# Patient Record
Sex: Male | Born: 1945
Health system: Southern US, Community
[De-identification: ages and names within clinical notes are randomized; demographics above are authoritative.]

## PROBLEM LIST (undated history)

## (undated) DIAGNOSIS — Z789 Other specified health status: Secondary | ICD-10-CM

## (undated) DIAGNOSIS — J302 Other seasonal allergic rhinitis: Secondary | ICD-10-CM

## (undated) DIAGNOSIS — R42 Dizziness and giddiness: Secondary | ICD-10-CM

## (undated) DIAGNOSIS — Z974 Presence of external hearing-aid: Secondary | ICD-10-CM

## (undated) DIAGNOSIS — Z973 Presence of spectacles and contact lenses: Secondary | ICD-10-CM

## (undated) HISTORY — DX: Dizziness and giddiness: R42

## (undated) HISTORY — PX: COLONOSCOPY: SHX174

## (undated) HISTORY — PX: TONSILLECTOMY: SUR1361

---

## 1974-07-22 HISTORY — PX: HERNIA REPAIR: SHX51

## 1991-07-23 HISTORY — PX: KNEE ARTHROSCOPY: SUR90

## 1999-11-09 ENCOUNTER — Encounter: Payer: Self-pay | Admitting: Orthopedic Surgery

## 1999-11-09 ENCOUNTER — Ambulatory Visit (HOSPITAL_COMMUNITY): Admission: RE | Admit: 1999-11-09 | Discharge: 1999-11-09 | Payer: Self-pay | Admitting: Orthopedic Surgery

## 2001-07-22 HISTORY — PX: TYMPANOPLASTY: SHX33

## 2003-01-17 ENCOUNTER — Encounter (INDEPENDENT_AMBULATORY_CARE_PROVIDER_SITE_OTHER): Payer: Self-pay | Admitting: *Deleted

## 2003-01-17 ENCOUNTER — Ambulatory Visit (HOSPITAL_COMMUNITY): Admission: RE | Admit: 2003-01-17 | Discharge: 2003-01-17 | Payer: Self-pay | Admitting: Gastroenterology

## 2005-05-10 ENCOUNTER — Ambulatory Visit: Payer: Self-pay | Admitting: Oncology

## 2007-07-23 HISTORY — PX: EYE SURGERY: SHX253

## 2009-07-22 HISTORY — PX: SHOULDER ARTHROSCOPY W/ ROTATOR CUFF REPAIR: SHX2400

## 2009-10-16 ENCOUNTER — Encounter: Admission: RE | Admit: 2009-10-16 | Discharge: 2009-10-16 | Payer: Self-pay | Admitting: Internal Medicine

## 2010-12-07 NOTE — Op Note (Signed)
   NAME:  Andrew Knight, Andrew Knight                       ACCOUNT NO.:  [id number]   MEDICAL RECORD NO.:  [id number]                   PATIENT TYPE:  AMB   LOCATION:  ENDO                                 FACILITY:  [facility]   PHYSICIAN:  Bernette Redbird, M.D.                DATE OF BIRTH:  04/10/1946   DATE OF PROCEDURE:  01/17/2003  DATE OF DISCHARGE:                                 OPERATIVE REPORT   PROCEDURE:  Colonoscopy with biopsy.   INDICATIONS FOR PROCEDURE:  This is a 65 year old gentleman who had a  screening flexible sigmoidoscopy by me about five years ago which was normal  and comes in now with a family history of colon polyps in his father,  unknown histology, but no worrisome symptoms.   FINDINGS:  Diminutive polyp above cecum.  Scattered diverticulosis.   DESCRIPTION OF PROCEDURE:  The nature, purpose, and risks of the procedure  had been discussed with the patient who provided written consent.  Sedation  was fentanyl 50 mcg and Versed 5 mg IV without arrhythmias or desaturation.   The Olympus adult video colonoscope was easily advanced to the cecum as  identified by clear visualization of the appendiceal orifice and pullback  was then performed.  Just above the cecum was a tiny 2 mm sessile polyp  removed by several cold biopsies.  No other polyps were seen and there was  no evidence of cancer, colitis, vascular malformations, or significant  diverticulosis, although, a few scattered diverticula were noted in the  colon.  Retroflexion in the rectum as well as reinspection of the  rectosigmoid was unremarkable.  Digital examination of the prostate was  unremarkable.  The patient tolerated the procedure well and there were no  apparent complications.   IMPRESSION:  Solitary diminutive polyp.  Minimal diverticulosis.  Otherwise  unremarkable examination.   PLAN:  Await pathology of the polyp.  Probable follow-up colonoscopy in five  years in view of the family history of  colon polyps.                                               Bernette Redbird, M.D.    RB/MEDQ  D:  01/17/2003  T:  01/17/2003  Job:  [id number]   cc:   Christella Noa, M.D.  [address] [place]  [place], [place] [address]  Fax: [phone]

## 2011-06-06 ENCOUNTER — Encounter (HOSPITAL_BASED_OUTPATIENT_CLINIC_OR_DEPARTMENT_OTHER)
Admission: RE | Disposition: A | Discharge status: Home / Self Care | Payer: Self-pay | Source: Ambulatory Visit | Attending: Orthopedic Surgery

## 2011-06-06 ENCOUNTER — Encounter (HOSPITAL_BASED_OUTPATIENT_CLINIC_OR_DEPARTMENT_OTHER): Payer: Self-pay | Admitting: Certified Registered"

## 2011-06-06 ENCOUNTER — Ambulatory Visit (HOSPITAL_BASED_OUTPATIENT_CLINIC_OR_DEPARTMENT_OTHER)
Admission: RE | Admit: 2011-06-06 | Discharge: 2011-06-06 | Disposition: A | Discharge status: Home / Self Care | Payer: Medicare Other | Source: Ambulatory Visit | Attending: Orthopedic Surgery | Admitting: Orthopedic Surgery

## 2011-06-06 ENCOUNTER — Encounter (HOSPITAL_BASED_OUTPATIENT_CLINIC_OR_DEPARTMENT_OTHER): Payer: Self-pay | Admitting: *Deleted

## 2011-06-06 ENCOUNTER — Ambulatory Visit (HOSPITAL_BASED_OUTPATIENT_CLINIC_OR_DEPARTMENT_OTHER): Payer: Medicare Other | Admitting: Certified Registered"

## 2011-06-06 DIAGNOSIS — M25819 Other specified joint disorders, unspecified shoulder: Secondary | ICD-10-CM | POA: Insufficient documentation

## 2011-06-06 DIAGNOSIS — M66329 Spontaneous rupture of flexor tendons, unspecified upper arm: Secondary | ICD-10-CM | POA: Insufficient documentation

## 2011-06-06 DIAGNOSIS — M719 Bursopathy, unspecified: Secondary | ICD-10-CM | POA: Insufficient documentation

## 2011-06-06 DIAGNOSIS — Z5333 Arthroscopic surgical procedure converted to open procedure: Secondary | ICD-10-CM | POA: Insufficient documentation

## 2011-06-06 DIAGNOSIS — Z87891 Personal history of nicotine dependence: Secondary | ICD-10-CM | POA: Insufficient documentation

## 2011-06-06 DIAGNOSIS — M67919 Unspecified disorder of synovium and tendon, unspecified shoulder: Secondary | ICD-10-CM | POA: Insufficient documentation

## 2011-06-06 HISTORY — PX: SHOULDER ARTHROSCOPY: SHX128

## 2011-06-06 HISTORY — DX: Other specified health status: Z78.9

## 2011-06-06 SURGERY — ARTHROSCOPY, SHOULDER
Anesthesia: General | Site: Shoulder | Laterality: Right | Wound class: Clean

## 2011-06-06 MED ORDER — OXYCODONE-ACETAMINOPHEN 5-325 MG PO TABS: 1.0000 | ORAL_TABLET | ORAL | Status: AC | PRN

## 2011-06-06 MED ORDER — PROPOFOL 10 MG/ML IV EMUL
INTRAVENOUS | Status: DC | PRN
  Administered 2011-06-06: 180 mg via INTRAVENOUS

## 2011-06-06 MED ORDER — BUPIVACAINE HCL (PF) 0.25 % IJ SOLN
INTRAMUSCULAR | Status: DC | PRN
  Administered 2011-06-06: 10 mL

## 2011-06-06 MED ORDER — FENTANYL CITRATE 0.05 MG/ML IJ SOLN
INTRAMUSCULAR | Status: DC | PRN
  Administered 2011-06-06: 100 ug via INTRAVENOUS

## 2011-06-06 MED ORDER — EPHEDRINE SULFATE 50 MG/ML IJ SOLN
INTRAMUSCULAR | Status: DC | PRN
  Administered 2011-06-06: 10 mg via INTRAVENOUS

## 2011-06-06 MED ORDER — SODIUM CHLORIDE 0.9 % IR SOLN
Status: DC | PRN
  Administered 2011-06-06: 2 mL

## 2011-06-06 MED ORDER — SUCCINYLCHOLINE CHLORIDE 20 MG/ML IJ SOLN
INTRAMUSCULAR | Status: DC | PRN
  Administered 2011-06-06: 100 mg via INTRAVENOUS

## 2011-06-06 MED ORDER — ONDANSETRON HCL 4 MG/2ML IJ SOLN: 4.0000 mg | Freq: Once | INTRAMUSCULAR | Status: DC | PRN

## 2011-06-06 MED ORDER — HYDROMORPHONE HCL PF 1 MG/ML IJ SOLN: 0.2500 mg | INTRAMUSCULAR | Status: DC | PRN

## 2011-06-06 MED ORDER — ONDANSETRON HCL 4 MG/2ML IJ SOLN
INTRAMUSCULAR | Status: DC | PRN
  Administered 2011-06-06: 4 mg via INTRAVENOUS

## 2011-06-06 MED ORDER — MIDAZOLAM HCL 2 MG/2ML IJ SOLN
1.0000 mg | INTRAMUSCULAR | Status: DC | PRN
  Administered 2011-06-06: 2 mg via INTRAVENOUS

## 2011-06-06 MED ORDER — LACTATED RINGERS IV SOLN
INTRAVENOUS | Status: DC
  Administered 2011-06-06: 09:00:00 via INTRAVENOUS

## 2011-06-06 MED ORDER — MIDAZOLAM HCL 2 MG/2ML IJ SOLN: 1.0000 mg | Freq: Once | INTRAMUSCULAR | Status: DC

## 2011-06-06 MED ORDER — LACTATED RINGERS IV SOLN: INTRAVENOUS | Status: DC

## 2011-06-06 MED ORDER — FENTANYL CITRATE 0.05 MG/ML IJ SOLN: 50.0000 ug | INTRAMUSCULAR | Status: DC | PRN

## 2011-06-06 MED ORDER — CEFAZOLIN SODIUM 1-5 GM-% IV SOLN
1.0000 g | Freq: Once | INTRAVENOUS | Status: AC
  Administered 2011-06-06: 2 g via INTRAVENOUS

## 2011-06-06 MED ORDER — FENTANYL CITRATE 0.05 MG/ML IJ SOLN
100.0000 ug | Freq: Once | INTRAMUSCULAR | Status: AC
  Administered 2011-06-06: 100 ug via INTRAVENOUS

## 2011-06-06 MED ORDER — DEXAMETHASONE SODIUM PHOSPHATE 4 MG/ML IJ SOLN
INTRAMUSCULAR | Status: DC | PRN
  Administered 2011-06-06: 4 mg via INTRAVENOUS

## 2011-06-06 SURGICAL SUPPLY — 92 items
ADH SKN CLS APL DERMABOND .7 (GAUZE/BANDAGES/DRESSINGS) ×1
APL SKNCLS STERI-STRIP NONHPOA (GAUZE/BANDAGES/DRESSINGS) ×1
BENZOIN TINCTURE PRP APPL 2/3 (GAUZE/BANDAGES/DRESSINGS) ×1 IMPLANT
BLADE 4.2CUDA (BLADE) IMPLANT
BLADE CUDA 5.5 (BLADE) IMPLANT
BLADE CUTTER GATOR 3.5 (BLADE) IMPLANT
BLADE GREAT WHITE 4.2 (BLADE) IMPLANT
BLADE SURG 15 STRL LF DISP TIS (BLADE) IMPLANT
BLADE SURG 15 STRL SS (BLADE)
BLADE SURG ROTATE 9660 (MISCELLANEOUS) IMPLANT
BUR 3.5 LG SPHERICAL (BURR) IMPLANT
BUR OVAL 4.0 (BURR) ×2 IMPLANT
BUR OVAL 6.0 (BURR) IMPLANT
BUR VERTEX HOODED 4.5 (BURR) ×1 IMPLANT
BURR 3.5 LG SPHERICAL (BURR)
CANISTER OMNI JUG 16 LITER (MISCELLANEOUS) ×2 IMPLANT
CANISTER SUCTION 2500CC (MISCELLANEOUS) IMPLANT
CANNULA 5.75X71 LONG (CANNULA) ×2 IMPLANT
CANNULA TWIST IN 8.25X7CM (CANNULA) IMPLANT
CHLORAPREP W/TINT 26ML (MISCELLANEOUS) ×2 IMPLANT
CLOTH BEACON ORANGE TIMEOUT ST (SAFETY) ×2 IMPLANT
DECANTER SPIKE VIAL GLASS SM (MISCELLANEOUS) IMPLANT
DERMABOND ADVANCED (GAUZE/BANDAGES/DRESSINGS) ×1
DERMABOND ADVANCED .7 DNX12 (GAUZE/BANDAGES/DRESSINGS) IMPLANT
DRAPE INCISE IOBAN 66X45 STRL (DRAPES) ×2 IMPLANT
DRAPE STERI 35X30 U-POUCH (DRAPES) ×2 IMPLANT
DRAPE SURG 17X23 STRL (DRAPES) ×2 IMPLANT
DRAPE U 20/CS (DRAPES) ×2 IMPLANT
DRAPE U-SHAPE 47X51 STRL (DRAPES) ×2 IMPLANT
DRAPE U-SHAPE 76X120 STRL (DRAPES) ×4 IMPLANT
DRSG PAD ABDOMINAL 8X10 ST (GAUZE/BANDAGES/DRESSINGS) ×2 IMPLANT
ELECT REM PT RETURN 9FT ADLT (ELECTROSURGICAL) ×2
ELECTRODE REM PT RTRN 9FT ADLT (ELECTROSURGICAL) ×1 IMPLANT
FIBER LOOP ×1 IMPLANT
FIBER LOOP  AR7234 ×1 IMPLANT
GAUZE SPONGE 4X4 16PLY XRAY LF (GAUZE/BANDAGES/DRESSINGS) IMPLANT
GAUZE XEROFORM 1X8 LF (GAUZE/BANDAGES/DRESSINGS) ×2 IMPLANT
GLOVE BIO SURGEON STRL SZ 6.5 (GLOVE) ×1 IMPLANT
GLOVE BIO SURGEON STRL SZ7.5 (GLOVE) ×3 IMPLANT
GLOVE BIOGEL PI IND STRL 8 (GLOVE) ×2 IMPLANT
GLOVE BIOGEL PI INDICATOR 8 (GLOVE) ×1
GOWN PREVENTION PLUS XLARGE (GOWN DISPOSABLE) ×2 IMPLANT
LASSO CRESCENT QUICKPASS (SUTURE) ×1 IMPLANT
NDL 1/2 CIR CATGUT .05X1.09 (NEEDLE) IMPLANT
NDL SCORPION MULTI FIRE (NEEDLE) IMPLANT
NDL SUT 6 .5 CRC .975X.05 MAYO (NEEDLE) IMPLANT
NEEDLE 1/2 CIR CATGUT .05X1.09 (NEEDLE) IMPLANT
NEEDLE MAYO TAPER (NEEDLE)
NEEDLE SCORPION MULTI FIRE (NEEDLE) IMPLANT
NS IRRIG 1000ML POUR BTL (IV SOLUTION) IMPLANT
PACK ARTHROSCOPY DSU (CUSTOM PROCEDURE TRAY) ×2 IMPLANT
PACK BASIN DAY SURGERY FS (CUSTOM PROCEDURE TRAY) ×2 IMPLANT
PENCIL BUTTON HOLSTER BLD 10FT (ELECTRODE) IMPLANT
RESECTOR FULL RADIUS 4.2MM (BLADE) ×2 IMPLANT
RESECTOR FULL RADIUS 4.8MM (BLADE) IMPLANT
SHEET MEDIUM DRAPE 40X70 STRL (DRAPES) ×1 IMPLANT
SLEEVE SCD COMPRESS KNEE MED (MISCELLANEOUS) ×2 IMPLANT
SLING ARM FOAM STRAP LRG (SOFTGOODS) ×1 IMPLANT
SLING ARM FOAM STRAP MED (SOFTGOODS) IMPLANT
SLING ARM FOAM STRAP XLG (SOFTGOODS) IMPLANT
SLING ARM IMMOBILIZER MED (SOFTGOODS) IMPLANT
SPONGE GAUZE 4X4 12PLY (GAUZE/BANDAGES/DRESSINGS) ×2 IMPLANT
SPONGE LAP 4X18 X RAY DECT (DISPOSABLE) IMPLANT
STRIP CLOSURE SKIN 1/2X4 (GAUZE/BANDAGES/DRESSINGS) ×1 IMPLANT
SUCTION FRAZIER TIP 10 FR DISP (SUCTIONS) ×2 IMPLANT
SUPPORT WRAP ARM LG (MISCELLANEOUS) IMPLANT
SUT BONE WAX W31G (SUTURE) IMPLANT
SUT ETHIBOND 2 OS 4 DA (SUTURE) IMPLANT
SUT ETHILON 3 0 PS 1 (SUTURE) ×2 IMPLANT
SUT ETHILON 4 0 PS 2 18 (SUTURE) IMPLANT
SUT FIBERWIRE #2 38 T-5 BLUE (SUTURE)
SUT FIBERWIRE 2-0 18 17.9 3/8 (SUTURE)
SUT MNCRL AB 3-0 PS2 18 (SUTURE) IMPLANT
SUT MNCRL AB 4-0 PS2 18 (SUTURE) IMPLANT
SUT PDS AB 0 CT 36 (SUTURE) IMPLANT
SUT PROLENE 3 0 PS 2 (SUTURE) ×2 IMPLANT
SUT VIC AB 0 CT1 18XCR BRD 8 (SUTURE) IMPLANT
SUT VIC AB 0 CT1 8-18 (SUTURE)
SUT VIC AB 2-0 SH 18 (SUTURE) IMPLANT
SUT VIC AB 2-0 SH 27 (SUTURE) ×2
SUT VIC AB 2-0 SH 27XBRD (SUTURE) IMPLANT
SUTURE FIBERWR #2 38 T-5 BLUE (SUTURE) IMPLANT
SUTURE FIBERWR 2-0 18 17.9 3/8 (SUTURE) IMPLANT
SYR BULB 3OZ (MISCELLANEOUS) IMPLANT
TAPE HYPAFIX 6X30 (GAUZE/BANDAGES/DRESSINGS) ×1 IMPLANT
TOWEL OR 17X24 6PK STRL BLUE (TOWEL DISPOSABLE) ×2 IMPLANT
TOWEL OR NON WOVEN STRL DISP B (DISPOSABLE) ×2 IMPLANT
TUBE CONNECTING 20X1/4 (TUBING) ×2 IMPLANT
TUBING ARTHROSCOPY IRRIG 16FT (MISCELLANEOUS) ×2 IMPLANT
WAND STAR VAC 90 (SURGICAL WAND) ×2 IMPLANT
WATER STERILE IRR 1000ML POUR (IV SOLUTION) ×2 IMPLANT
YANKAUER SUCT BULB TIP NO VENT (SUCTIONS) ×1 IMPLANT

## 2011-06-06 NOTE — Anesthesia Preprocedure Evaluation (Signed)
Anesthesia Evaluation  Patient identified by MRN, date of birth, ID band Patient awake    Reviewed: Allergy & Precautions, H&P , NPO status , Patient's Chart, lab work & pertinent test results  Airway Mallampati: II      Dental  (+) Teeth Intact   Pulmonary neg pulmonary ROS,  clear to auscultation        Cardiovascular neg cardio ROS Regular Normal    Neuro/Psych    GI/Hepatic negative GI ROS,   Endo/Other    Renal/GU      Musculoskeletal   Abdominal   Peds  Hematology   Anesthesia Other Findings   Reproductive/Obstetrics                           Anesthesia Physical Anesthesia Plan  ASA: I  Anesthesia Plan: General   Post-op Pain Management:    Induction: Intravenous  Airway Management Planned: Oral ETT  Additional Equipment:   Intra-op Plan:   Post-operative Plan: Extubation in OR  Informed Consent: I have reviewed the patients History and Physical, chart, labs and discussed the procedure including the risks, benefits and alternatives for the proposed anesthesia with the patient or authorized representative who has indicated his/her understanding and acceptance.   Dental advisory given  Plan Discussed with: CRNA and Surgeon  Anesthesia Plan Comments:         Anesthesia Quick Evaluation

## 2011-06-06 NOTE — H&P (Signed)
CORBIN FALCK is an 65 y.o. male.   Chief Complaint: R shoudler pain and popping 3 HPI: History of chronic large rotator cuff tear with continued symptoms despite non-operative management > 44yr.   Past Medical History  Diagnosis Date  . No pertinent past medical history     Past Surgical History  Procedure Date  . Shoulder arthroscopy w/ rotator cuff repair 2011    left  . Hernia repair   . Tympanoplasty 2003  . Eye surgery   . Knee arthroscopy     History reviewed. No pertinent family history. Social History:  reports that he has quit smoking. He does not have any smokeless tobacco history on file. He reports that he drinks about 3 ounces of alcohol per week. He reports that he does not use illicit drugs.  Allergies: No Known Allergies  Medications Prior to Admission  Medication Dose Route Frequency Provider Last Rate Last Dose  . fentaNYL (SUBLIMAZE) injection 100 mcg  100 mcg Intravenous Once Melonie Florida, MD   100 mcg at 06/06/11 1023  . fentaNYL (SUBLIMAZE) injection 50-100 mcg  50-100 mcg Intravenous PRN Melonie Florida, MD      . lactated ringers infusion   Intravenous Continuous Melonie Florida, MD 10 mL/hr at 06/06/11 0919    . midazolam (VERSED) injection 1-2 mg  1-2 mg Intravenous PRN Melonie Florida, MD   2 mg at 06/06/11 1023  . midazolam (VERSED) injection 1-2 mg  1-2 mg Intravenous Once Melonie Florida, MD       Medications Prior to Admission  Medication Sig Dispense Refill  . aspirin 81 MG tablet Take 81 mg by mouth daily.        . cholecalciferol (VITAMIN D) 1000 UNITS tablet Take 1,000 Units by mouth daily.          No results found for this or any previous visit (from the past 48 hour(s)). No results found.  Review of Systems  All other systems reviewed and are negative.    Blood pressure 120/73, pulse 58, temperature 97.8 F (36.6 C), temperature source Oral, resp. rate 7, height 5\' 10"  (1.778 m), weight 81.647 kg (180 lb), SpO2  99.00%. Physical Exam  Constitutional: He is oriented to person, place, and time. He appears well-developed and well-nourished.  HENT:  Head: Atraumatic.  Eyes: EOM are normal.  Neck: Normal range of motion.  Cardiovascular: Intact distal pulses.   Respiratory: Effort normal.  GI: Soft.  Musculoskeletal: Normal range of motion.  Neurological: He is alert and oriented to person, place, and time.  Skin: Skin is warm and dry.     Assessment/Plan Arthroscopic debridement, SAD, possible biceps tenodesis.  Mable Paris 06/06/2011, 10:36 AM

## 2011-06-06 NOTE — Anesthesia Postprocedure Evaluation (Signed)
  Anesthesia Post-op Note  Patient: Andrew Knight  Procedure(s) Performed:  ARTHROSCOPY SHOULDER - arthroscopy right shoulder Extensive debridment  irreparable rotator cuff tear Bio Tenetomy Acromioplasty and open Biceps Tenodesis  Patient Location: PACU  Anesthesia Type: General  Level of Consciousness: awake, alert  and oriented  Airway and Oxygen Therapy: Patient Spontanous Breathing  Post-op Pain: none  Post-op Assessment: Post-op Vital signs reviewed and Patient's Cardiovascular Status Stable  Post-op Vital Signs: stable  Complications: No apparent anesthesia complications

## 2011-06-06 NOTE — Transfer of Care (Signed)
Immediate Anesthesia Transfer of Care Note  Patient: Andrew Knight  Procedure(s) Performed:  ARTHROSCOPY SHOULDER - arthroscopy right shoulder Extensive debridment  irreparable rotator cuff tear Bio Tenetomy Acromioplasty and open Biceps Tenodesis  Patient Location: PACU  Anesthesia Type: General  Level of Consciousness: sedated and patient cooperative  Airway & Oxygen Therapy: Patient Spontanous Breathing and Patient connected to face mask oxygen  Post-op Assessment: Report given to PACU RN and Post -op Vital signs reviewed and stable  Post vital signs: Reviewed and stable  Complications: No apparent anesthesia complications

## 2011-06-06 NOTE — Progress Notes (Signed)
EKG waived by Dr Noreene Larsson

## 2011-06-06 NOTE — Op Note (Signed)
NAMESEIYA, SILSBY             ACCOUNT NO.:  [id number]  MEDICAL RECORD NO.:  [id number]  LOCATION:                                 FACILITY:  PHYSICIAN:  Jones Broom, MD         DATE OF BIRTH:  DATE OF PROCEDURE:  06/06/2011 DATE OF DISCHARGE:                              OPERATIVE REPORT   PREOPERATIVE DIAGNOSES: 1. Right shoulder irreparable rotator cuff tear. 2. Right shoulder chronic impingement. 3. Right shoulder likely long head biceps tear.  POSTOPERATIVE DIAGNOSES: 1. Right shoulder irreparable rotator cuff tear of the supraspinatus,     extending in to the infraspinatus. 2. Right shoulder impingement. 3. Right shoulder long head biceps tear.  PROCEDURES PERFORMED: 1. Right shoulder arthroscopic extensive debridement of irreparable     rotator cuff tear and arthroscopic tenotomy of the long head     biceps. 2. Right shoulder arthroscopic acromioplasty 3. Right shoulder open subpectoral biceps tenodesis.  ATTENDING SURGEON:  Jones Broom, MD  ASSISTANT:  None.  ANESTHESIA:  GETA with preoperative interscalene block.  COMPLICATIONS:  None.  DRAINS:  None.  SPECIMENS:  None.  ESTIMATED BLOOD LOSS:  Less than 20 mL.  INDICATION FOR SURGERY:  The patient is a 65 year old gentleman who has had a long history of a known rotator cuff tear on the right side.  He has had extensive non-operative management with injections and exercises.  He went on to have continued pain and mechanical symptoms in the shoulder and wished to go ahead with operative treatment.  He understood risks, benefits, alternatives of the procedure including, but not limited to risk of bleeding, infection, damage to neurovascular structures, risk of incomplete pain relief.  He understood that the rotator cuff was not likely to be repairable but that he likely had biceps pathology which can significantly lead to continued pain in patients with large irreparable rotator cuff tears.   He wished to go ahead with a biceps tenodesis if significant pathology was found at the time of surgery.  OPERATIVE FINDINGS:  Examination under anesthesia demonstrated no stiffness or instability.  Diagnostic arthroscopy revealed extensive tearing of the long head biceps tendon.  He had retracted tears of the supra and infraspinatus nearly to the level of the glenoid.  An attempt to mobilize this tear was unsuccessful and as expected, the tear was not able to be repaired.  The articular surfaces were intact without significant chondromalacia.  There was some synovitis in the joint and the subacromial space.  He had a large anterior acromial spur which was type 3.  The biceps tendon was tenotomized arthroscopically and subsequently an open subpectoral biceps tenodesis through bone tunnels was performed.  The tendon edge of the rotator cuff was debrided back behind the level of the glenoid to prevent any mechanical symptoms. Acromioplasty was performed in a standard fashion.  DESCRIPTION OF PROCEDURE:  The patient was identified in the preoperative holding area where I personally marked the operative site after verifying site, side, and procedure with the patient.  He was taken back to the operating room where general anesthesia was induced without complication.  He was placed in the beach-chair position.  He did have  a preoperative interscalene block.  All extremities were carefully padded in position.  After sterile prepping and draping, standard posterior portal was established with the arthroscope and an anterior portal was established above the subscapularis with needle localization.  The upper border of the subscapularis was noted to be frayed but not torn.  The shaver was used through the anterior portal to debride the long head biceps tendon which was hanging into the joint. The remaining biceps tendon was felt to be greater than 50% torn. Therefore, a biceps tenotomy was  performed with a large biter through the anterior portal.  The remainder of the diagnostic arthroscopy as described above.  The arthroscope was then introduced in the subacromial space and the rotator cuff was carefully examined and probed.  An attempt to pull it out to the greater tuberosity was unsuccessful and I felt that given the MRI findings and chronicity of this tear in attempt to trying repair this would not be fruitful.  Therefore, the tendon edge was debrided back behind the articular margin.  The subacromial space was cleaned up with the ArthroCare and the shaver.  The anterior acromial spur was exposed, gently peeling the coracoacromial ligament anteriorly.  The 4-mm bur was then used from the lateral portal to perform an acromioplasty in a standard fashion, creating a smooth undersurface of the acromion from posterior to anterior.  The arthroscopic equipment was then removed from the joint and the patient was laid back to about 30 degrees in the beach-chair position.  An approximately 3-cm incision was made in the axillary crease.  Dissection was carried down to the lower border of the pectoralis major.  The interval between the pectoralis major and short head of the biceps was developed, exposing the anterior humerus.  The long head biceps was found and delivered out the wound.  The tension was estimated for appropriate length of the biceps and a #2 FiberWire fiber loop was used to create a locking stitch configuration and the remainder of the long head biceps tendon was discarded.  The lower end of the bicipital groove was exposed and 2 drill holes were made, 1 proximal, 1 about 15 mm distal with a proximal drill smaller than the diameter of the tendon and the distal drill which was approximately 3 mm to be able to pass the suture.  The [employer] was then used to pass the sutures from proximal to distal, and 1 suture limb was passed medial and 1 lateral to the  tendon.  After dunking the tendon into the intramedullary canal, the sutures were tied over the anterior long head biceps, taking care not to strangulate the tendon.  The wound was then copiously irrigated with normal saline and subsequently closed in layers with 2-0 Vicryl in deep dermal layer and Dermabond for skin closure.  The arthroscopic portals were then closed with 3-0 nylon and sterile dressings were applied including Xeroform, 4x4s, ABDs, and tape.  The patient was then placed in a sling, allowed to awaken from general anesthesia, transferred to the stretcher, and taken to the recovery room in stable condition.  POSTOPERATIVE PLAN:  He will be discharged home with his family today. He can begin some gentle shoulder exercises when comfortable in the next few days.  We will avoid active elbow flexion for 4-6 weeks postoperatively.     Jones Broom, MD     JC/MEDQ  D:  06/06/2011  T:  06/06/2011  Job:  [id number]

## 2011-06-06 NOTE — Brief Op Note (Signed)
06/06/2011  12:37 PM  PATIENT:  Andrew Knight  65 y.o. male  PRE-OPERATIVE DIAGNOSIS: 1. right rotator cuff tear 2. R shoulder impingement  POST-OPERATIVE DIAGNOSIS: 1. Right irrepairable rotator cuff tear 2. R shoulder impingment 3. R long head biceps tear  PROCEDURE:  Procedure(s): ARTHROSCOPY SHOULDER 1. R arth extensive debridement of irrepairable rotator cuff tear 2. R arth acromioplasty 3. R open subpectoral biceps tenodesis  SURGEON:  Surgeon(s): Mable Paris, MD  PHYSICIAN ASSISTANT:   ASSISTANTS: none   ANESTHESIA:   regional and general  EBL:  Total I/O In: 1000 [I.V.:1000] Out: - min  BLOOD ADMINISTERED:none  DRAINS: none   LOCAL MEDICATIONS USED: 1/4% MARCAINE 10CC  SPECIMEN:  No Specimen  DISPOSITION OF SPECIMEN:  N/A  COUNTS:  YES  TOURNIQUET:  * No tourniquets in log *  DICTATION: .Other Dictation: Dictation Number [id number]  PLAN OF CARE: Discharge to home after PACU  PATIENT DISPOSITION:  PACU - hemodynamically stable.   Delay start of Pharmacological VTE agent (>24hrs) due to surgical blood loss or risk of bleeding:  {YES/NO/NOT APPLICABLE:20182

## 2011-06-06 NOTE — Anesthesia Procedure Notes (Addendum)
Anesthesia Regional Block:  Interscalene brachial plexus block  Pre-Anesthetic Checklist: ,, timeout performed,, Correct Site,, Correct Procedure,, site marked,,, surgical consent,, at surgeon's request  Laterality: Right  Prep: chloraprep       Needles:  Injection technique: Single-shot  Needle Type: Stimulator Needle - 40      Needle Gauge: 22 and 22 G    Additional Needles:  Procedures: nerve stimulator Interscalene brachial plexus block  Nerve Stimulator or Paresthesia:  Response: 0.4 mA, 1 cm  Additional Responses:   Narrative:  Start time: 06/06/2011 10:18 AM End time: 06/06/2011 10:28 AM Injection made incrementally with aspirations every 30 mL.  Performed by: Personally    Procedure Name: Intubation Date/Time: 06/06/2011 11:01 AM Performed by: Radford Pax Pre-anesthesia Checklist: Patient identified, Timeout performed, Emergency Drugs available, Suction available and Patient being monitored Patient Re-evaluated:Patient Re-evaluated prior to inductionOxygen Delivery Method: Circle System Utilized Preoxygenation: Pre-oxygenation with 100% oxygen Intubation Type: IV induction Ventilation: Mask ventilation without difficulty Laryngoscope Size: 3 Grade View: Grade III Tube type: Oral Tube size: 8.0 mm Number of attempts: 2 Airway Equipment and Method: stylet and oral airway Placement Confirmation: ETT inserted through vocal cords under direct vision,  positive ETCO2 and breath sounds checked- equal and bilateral Secured at: 22 cm Tube secured with: Tape (ETT secured wth  pink tape) Dental Injury: Teeth and Oropharynx as per pre-operative assessment  Difficulty Due To: Difficulty was anticipated and Difficult Airway- due to dentition Comments: Pt. Has small mouth, long maxillary anterior teeth, narrow arch and high vault.  Pressure over cords needed to visualize glottis.  Dr. Noreene Larsson successfully intubated, after one failed attempt byKBordick,  CRNA

## 2011-06-06 NOTE — Progress Notes (Signed)
Patient tolerated block well, side rails up and patient monitored throughout procedure.

## 2011-06-06 NOTE — Procedures (Signed)
Addendum: R. Interscalene Block- 30 cc 0.5% Marcaine used.                                      Kipp Brood, MD

## 2011-06-11 ENCOUNTER — Encounter (HOSPITAL_BASED_OUTPATIENT_CLINIC_OR_DEPARTMENT_OTHER): Payer: Self-pay | Admitting: Orthopedic Surgery

## 2011-07-05 ENCOUNTER — Encounter (HOSPITAL_BASED_OUTPATIENT_CLINIC_OR_DEPARTMENT_OTHER): Payer: Self-pay

## 2011-07-26 DIAGNOSIS — M75120 Complete rotator cuff tear or rupture of unspecified shoulder, not specified as traumatic: Secondary | ICD-10-CM | POA: Diagnosis not present

## 2011-07-30 DIAGNOSIS — M75120 Complete rotator cuff tear or rupture of unspecified shoulder, not specified as traumatic: Secondary | ICD-10-CM | POA: Diagnosis not present

## 2011-07-30 DIAGNOSIS — M66329 Spontaneous rupture of flexor tendons, unspecified upper arm: Secondary | ICD-10-CM | POA: Diagnosis not present

## 2011-08-01 DIAGNOSIS — M75120 Complete rotator cuff tear or rupture of unspecified shoulder, not specified as traumatic: Secondary | ICD-10-CM | POA: Diagnosis not present

## 2011-08-06 DIAGNOSIS — M75120 Complete rotator cuff tear or rupture of unspecified shoulder, not specified as traumatic: Secondary | ICD-10-CM | POA: Diagnosis not present

## 2011-08-13 DIAGNOSIS — M66329 Spontaneous rupture of flexor tendons, unspecified upper arm: Secondary | ICD-10-CM | POA: Diagnosis not present

## 2011-08-16 DIAGNOSIS — M75120 Complete rotator cuff tear or rupture of unspecified shoulder, not specified as traumatic: Secondary | ICD-10-CM | POA: Diagnosis not present

## 2011-08-20 DIAGNOSIS — M66329 Spontaneous rupture of flexor tendons, unspecified upper arm: Secondary | ICD-10-CM | POA: Diagnosis not present

## 2011-08-20 DIAGNOSIS — M75120 Complete rotator cuff tear or rupture of unspecified shoulder, not specified as traumatic: Secondary | ICD-10-CM | POA: Diagnosis not present

## 2011-08-27 DIAGNOSIS — M75120 Complete rotator cuff tear or rupture of unspecified shoulder, not specified as traumatic: Secondary | ICD-10-CM | POA: Diagnosis not present

## 2011-09-03 DIAGNOSIS — M75120 Complete rotator cuff tear or rupture of unspecified shoulder, not specified as traumatic: Secondary | ICD-10-CM | POA: Diagnosis not present

## 2011-09-03 DIAGNOSIS — M66329 Spontaneous rupture of flexor tendons, unspecified upper arm: Secondary | ICD-10-CM | POA: Diagnosis not present

## 2011-09-06 DIAGNOSIS — M75120 Complete rotator cuff tear or rupture of unspecified shoulder, not specified as traumatic: Secondary | ICD-10-CM | POA: Diagnosis not present

## 2011-09-10 DIAGNOSIS — M66329 Spontaneous rupture of flexor tendons, unspecified upper arm: Secondary | ICD-10-CM | POA: Diagnosis not present

## 2011-09-10 DIAGNOSIS — M75120 Complete rotator cuff tear or rupture of unspecified shoulder, not specified as traumatic: Secondary | ICD-10-CM | POA: Diagnosis not present

## 2011-09-13 DIAGNOSIS — M75120 Complete rotator cuff tear or rupture of unspecified shoulder, not specified as traumatic: Secondary | ICD-10-CM | POA: Diagnosis not present

## 2011-09-13 DIAGNOSIS — M66329 Spontaneous rupture of flexor tendons, unspecified upper arm: Secondary | ICD-10-CM | POA: Diagnosis not present

## 2011-09-17 DIAGNOSIS — M75120 Complete rotator cuff tear or rupture of unspecified shoulder, not specified as traumatic: Secondary | ICD-10-CM | POA: Diagnosis not present

## 2011-09-24 DIAGNOSIS — M66329 Spontaneous rupture of flexor tendons, unspecified upper arm: Secondary | ICD-10-CM | POA: Diagnosis not present

## 2011-09-26 DIAGNOSIS — M75120 Complete rotator cuff tear or rupture of unspecified shoulder, not specified as traumatic: Secondary | ICD-10-CM | POA: Diagnosis not present

## 2011-10-31 DIAGNOSIS — E7800 Pure hypercholesterolemia, unspecified: Secondary | ICD-10-CM | POA: Diagnosis not present

## 2011-11-04 DIAGNOSIS — Z125 Encounter for screening for malignant neoplasm of prostate: Secondary | ICD-10-CM | POA: Diagnosis not present

## 2011-11-04 DIAGNOSIS — J309 Allergic rhinitis, unspecified: Secondary | ICD-10-CM | POA: Diagnosis not present

## 2011-11-04 DIAGNOSIS — Z0000 Encounter for general adult medical examination without abnormal findings: Secondary | ICD-10-CM | POA: Diagnosis not present

## 2011-11-04 DIAGNOSIS — E7800 Pure hypercholesterolemia, unspecified: Secondary | ICD-10-CM | POA: Diagnosis not present

## 2011-12-02 DIAGNOSIS — H348192 Central retinal vein occlusion, unspecified eye, stable: Secondary | ICD-10-CM | POA: Diagnosis not present

## 2011-12-02 DIAGNOSIS — H2510 Age-related nuclear cataract, unspecified eye: Secondary | ICD-10-CM | POA: Diagnosis not present

## 2011-12-02 DIAGNOSIS — H353190 Nonexudative age-related macular degeneration, unspecified eye, stage unspecified: Secondary | ICD-10-CM | POA: Diagnosis not present

## 2011-12-02 DIAGNOSIS — H35379 Puckering of macula, unspecified eye: Secondary | ICD-10-CM | POA: Diagnosis not present

## 2012-01-02 ENCOUNTER — Ambulatory Visit (INDEPENDENT_AMBULATORY_CARE_PROVIDER_SITE_OTHER): Payer: Medicare Other | Admitting: Family Medicine

## 2012-01-02 VITALS — BP 106/70 | HR 88 | Temp 97.4°F | Resp 16

## 2012-01-02 DIAGNOSIS — S0100XA Unspecified open wound of scalp, initial encounter: Secondary | ICD-10-CM | POA: Diagnosis not present

## 2012-01-02 DIAGNOSIS — S0101XA Laceration without foreign body of scalp, initial encounter: Secondary | ICD-10-CM

## 2012-01-02 NOTE — Patient Instructions (Addendum)
Laceration Care, Adult °A laceration is a cut that goes through all layers of the skin. The cut goes into the tissue beneath the skin. °HOME CARE °For stitches (sutures) or staples: °· Keep the cut clean and dry.  °· If you have a bandage (dressing), change it at least once a day. Change the bandage if it gets wet or dirty, or as told by your doctor.  °· Wash the cut with soap and water 2 times a day. Rinse the cut with water. Pat it dry with a clean towel.  °· Put a thin layer of medicated cream on the cut as told by your doctor.  °· You may shower after the first 24 hours. Do not soak the cut in water until the stitches are removed.  °· Only take medicines as told by your doctor.  °· Have your stitches or staples removed as told by your doctor.  °For skin adhesive strips: °· Keep the cut clean and dry.  °· Do not get the strips wet. You may take a bath, but be careful to keep the cut dry.  °· If the cut gets wet, pat it dry with a clean towel.  °· The strips will fall off on their own. Do not remove the strips that are still stuck to the cut.  °For wound glue: °· You may shower or take baths. Do not soak or scrub the cut. Do not swim. Avoid heavy sweating until the glue falls off on its own. After a shower or bath, pat the cut dry with a clean towel.  °· Do not put medicine on your cut until the glue falls off.  °· If you have a bandage, do not put tape over the glue.  °· Avoid lots of sunlight or tanning lamps until the glue falls off. Put sunscreen on the cut for the first year to reduce your scar.  °· The glue will fall off on its own. Do not pick at the glue.  °You may need a tetanus shot if: °· You cannot remember when you had your last tetanus shot.  °· You have never had a tetanus shot.  °If you need a tetanus shot and you choose not to have one, you may get tetanus. Sickness from tetanus can be serious. °GET HELP RIGHT AWAY IF:  °· Your pain does not get better with medicine.  °· Your arm, hand, leg, or  foot loses feeling (numbness) or changes color.  °· Your cut is bleeding.  °· Your joint feels weak, or you cannot use your joint.  °· You have painful lumps on your body.  °· Your cut is red, puffy (swollen), or painful.  °· You have a red line on the skin near the cut.  °· You have yellowish-white fluid (pus) coming from the cut.  °· You have a fever.  °· You have a bad smell coming from the cut or bandage.  °· Your cut breaks open before or after stitches are removed.  °· You notice something coming out of the cut, such as wood or glass.  °· You cannot move a finger or toe.  °MAKE SURE YOU:  °· Understand these instructions.  °· Will watch your condition.  °· Will get help right away if you are not doing well or get worse.  °Document Released: 12/25/2007 Document Revised: 06/27/2011 Document Reviewed: 01/01/2011 °[employer]® Patient Information ©2012 [employer]. °

## 2012-01-02 NOTE — Progress Notes (Signed)
  Subjective:    Patient ID: Andrew Knight, male    DOB: 03-22-1946, 67 y.o.   MRN: [id number]  HPI    Review of Systems     Objective:   Physical Exam  2%lidocaine with epi 5cc+ 2% plain lidocaine 5cc(10 total) injected locally.  Sterile P&D.  No deep structure.  Approximated with 4.0 prolene, #7 s.i. Placed.  Patient tolerated procedure well.      Assessment & Plan:  SR X 7days.  Wound care reviewed.
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
## 2012-01-09 ENCOUNTER — Ambulatory Visit (INDEPENDENT_AMBULATORY_CARE_PROVIDER_SITE_OTHER): Payer: Medicare Other | Admitting: Physician Assistant

## 2012-01-09 VITALS — BP 112/69 | HR 84 | Temp 98.2°F | Resp 18 | Wt 186.0 lb

## 2012-01-09 DIAGNOSIS — S0190XA Unspecified open wound of unspecified part of head, initial encounter: Secondary | ICD-10-CM

## 2012-01-09 NOTE — Progress Notes (Signed)
  Subjective:    Patient ID: Andrew Knight, male    DOB: 1945/11/23, 66 y.o.   MRN: [id number]  HPI Here for suture removal on scalp.  He denies any problems.   Review of Systems  All other systems reviewed and are negative.       Objective:   Physical Exam  Nursing note and vitals reviewed. Constitutional: He appears well-developed and well-nourished.  Skin:       Scalp-well healed and approximated.  No erythema, no swelling, no sign of infection. #7 sutures removed without complication.          Assessment & Plan:  Wound head-healed RTC prn

## 2012-01-10 DIAGNOSIS — L219 Seborrheic dermatitis, unspecified: Secondary | ICD-10-CM | POA: Diagnosis not present

## 2012-01-10 DIAGNOSIS — Z85828 Personal history of other malignant neoplasm of skin: Secondary | ICD-10-CM | POA: Diagnosis not present

## 2012-01-10 DIAGNOSIS — L821 Other seborrheic keratosis: Secondary | ICD-10-CM | POA: Diagnosis not present

## 2012-06-04 DIAGNOSIS — H40019 Open angle with borderline findings, low risk, unspecified eye: Secondary | ICD-10-CM | POA: Diagnosis not present

## 2012-06-08 DIAGNOSIS — H348192 Central retinal vein occlusion, unspecified eye, stable: Secondary | ICD-10-CM | POA: Diagnosis not present

## 2012-06-08 DIAGNOSIS — H353190 Nonexudative age-related macular degeneration, unspecified eye, stage unspecified: Secondary | ICD-10-CM | POA: Diagnosis not present

## 2012-06-08 DIAGNOSIS — H35379 Puckering of macula, unspecified eye: Secondary | ICD-10-CM | POA: Diagnosis not present

## 2012-08-01 DIAGNOSIS — Z23 Encounter for immunization: Secondary | ICD-10-CM | POA: Diagnosis not present

## 2012-08-05 DIAGNOSIS — L219 Seborrheic dermatitis, unspecified: Secondary | ICD-10-CM | POA: Diagnosis not present

## 2012-08-05 DIAGNOSIS — L723 Sebaceous cyst: Secondary | ICD-10-CM | POA: Diagnosis not present

## 2012-08-05 DIAGNOSIS — L819 Disorder of pigmentation, unspecified: Secondary | ICD-10-CM | POA: Diagnosis not present

## 2012-08-05 DIAGNOSIS — Z85828 Personal history of other malignant neoplasm of skin: Secondary | ICD-10-CM | POA: Diagnosis not present

## 2012-08-05 DIAGNOSIS — L821 Other seborrheic keratosis: Secondary | ICD-10-CM | POA: Diagnosis not present

## 2012-11-09 DIAGNOSIS — E7800 Pure hypercholesterolemia, unspecified: Secondary | ICD-10-CM | POA: Diagnosis not present

## 2012-11-09 DIAGNOSIS — Z0000 Encounter for general adult medical examination without abnormal findings: Secondary | ICD-10-CM | POA: Diagnosis not present

## 2012-11-09 DIAGNOSIS — R5381 Other malaise: Secondary | ICD-10-CM | POA: Diagnosis not present

## 2012-11-09 DIAGNOSIS — Z125 Encounter for screening for malignant neoplasm of prostate: Secondary | ICD-10-CM | POA: Diagnosis not present

## 2012-12-29 DIAGNOSIS — H2510 Age-related nuclear cataract, unspecified eye: Secondary | ICD-10-CM | POA: Diagnosis not present

## 2012-12-31 DIAGNOSIS — H35379 Puckering of macula, unspecified eye: Secondary | ICD-10-CM | POA: Diagnosis not present

## 2012-12-31 DIAGNOSIS — H35059 Retinal neovascularization, unspecified, unspecified eye: Secondary | ICD-10-CM | POA: Diagnosis not present

## 2012-12-31 DIAGNOSIS — H348192 Central retinal vein occlusion, unspecified eye, stable: Secondary | ICD-10-CM | POA: Diagnosis not present

## 2013-01-16 DIAGNOSIS — S335XXA Sprain of ligaments of lumbar spine, initial encounter: Secondary | ICD-10-CM | POA: Diagnosis not present

## 2013-02-04 DIAGNOSIS — D485 Neoplasm of uncertain behavior of skin: Secondary | ICD-10-CM | POA: Diagnosis not present

## 2013-02-04 DIAGNOSIS — L820 Inflamed seborrheic keratosis: Secondary | ICD-10-CM | POA: Diagnosis not present

## 2013-02-04 DIAGNOSIS — Z85828 Personal history of other malignant neoplasm of skin: Secondary | ICD-10-CM | POA: Diagnosis not present

## 2013-02-04 DIAGNOSIS — L219 Seborrheic dermatitis, unspecified: Secondary | ICD-10-CM | POA: Diagnosis not present

## 2013-02-04 DIAGNOSIS — L821 Other seborrheic keratosis: Secondary | ICD-10-CM | POA: Diagnosis not present

## 2013-05-17 DIAGNOSIS — Z23 Encounter for immunization: Secondary | ICD-10-CM | POA: Diagnosis not present

## 2013-05-17 DIAGNOSIS — R1032 Left lower quadrant pain: Secondary | ICD-10-CM | POA: Diagnosis not present

## 2013-05-22 DIAGNOSIS — R42 Dizziness and giddiness: Secondary | ICD-10-CM

## 2013-05-22 HISTORY — DX: Dizziness and giddiness: R42

## 2013-06-16 ENCOUNTER — Emergency Department (HOSPITAL_COMMUNITY): Payer: No Typology Code available for payment source

## 2013-06-16 ENCOUNTER — Observation Stay (HOSPITAL_COMMUNITY): Payer: No Typology Code available for payment source

## 2013-06-16 ENCOUNTER — Encounter (HOSPITAL_COMMUNITY): Payer: Self-pay | Admitting: Emergency Medicine

## 2013-06-16 ENCOUNTER — Observation Stay (HOSPITAL_COMMUNITY)
Admission: EM | Admit: 2013-06-16 | Discharge: 2013-06-17 | Disposition: A | Discharge status: Home / Self Care | Payer: No Typology Code available for payment source | Attending: General Surgery | Admitting: General Surgery

## 2013-06-16 DIAGNOSIS — S0990XA Unspecified injury of head, initial encounter: Secondary | ICD-10-CM | POA: Diagnosis not present

## 2013-06-16 DIAGNOSIS — S298XXA Other specified injuries of thorax, initial encounter: Secondary | ICD-10-CM | POA: Diagnosis not present

## 2013-06-16 DIAGNOSIS — T148XXA Other injury of unspecified body region, initial encounter: Secondary | ICD-10-CM | POA: Diagnosis not present

## 2013-06-16 DIAGNOSIS — S069X9A Unspecified intracranial injury with loss of consciousness of unspecified duration, initial encounter: Secondary | ICD-10-CM

## 2013-06-16 DIAGNOSIS — S20219A Contusion of unspecified front wall of thorax, initial encounter: Secondary | ICD-10-CM | POA: Insufficient documentation

## 2013-06-16 DIAGNOSIS — S0993XA Unspecified injury of face, initial encounter: Secondary | ICD-10-CM | POA: Diagnosis not present

## 2013-06-16 DIAGNOSIS — IMO0002 Reserved for concepts with insufficient information to code with codable children: Secondary | ICD-10-CM | POA: Insufficient documentation

## 2013-06-16 DIAGNOSIS — S066X9A Traumatic subarachnoid hemorrhage with loss of consciousness of unspecified duration, initial encounter: Secondary | ICD-10-CM | POA: Diagnosis not present

## 2013-06-16 DIAGNOSIS — S06330A Contusion and laceration of cerebrum, unspecified, without loss of consciousness, initial encounter: Secondary | ICD-10-CM | POA: Diagnosis not present

## 2013-06-16 DIAGNOSIS — R079 Chest pain, unspecified: Secondary | ICD-10-CM | POA: Diagnosis not present

## 2013-06-16 DIAGNOSIS — L089 Local infection of the skin and subcutaneous tissue, unspecified: Secondary | ICD-10-CM | POA: Diagnosis not present

## 2013-06-16 DIAGNOSIS — R6889 Other general symptoms and signs: Secondary | ICD-10-CM | POA: Diagnosis not present

## 2013-06-16 DIAGNOSIS — I609 Nontraumatic subarachnoid hemorrhage, unspecified: Secondary | ICD-10-CM | POA: Diagnosis not present

## 2013-06-16 DIAGNOSIS — S060X9A Concussion with loss of consciousness of unspecified duration, initial encounter: Principal | ICD-10-CM | POA: Insufficient documentation

## 2013-06-16 DIAGNOSIS — S060XAA Concussion with loss of consciousness status unknown, initial encounter: Secondary | ICD-10-CM

## 2013-06-16 HISTORY — DX: Other seasonal allergic rhinitis: J30.2

## 2013-06-16 LAB — CBC
HCT: 46.3 % (ref 39.0–52.0)
RDW: 13.4 % (ref 11.5–15.5)
WBC: 13 10*3/uL — ABNORMAL HIGH (ref 4.0–10.5)

## 2013-06-16 LAB — RAPID URINE DRUG SCREEN, HOSP PERFORMED
Amphetamines: NOT DETECTED
Barbiturates: NOT DETECTED
Benzodiazepines: NOT DETECTED
Tetrahydrocannabinol: NOT DETECTED

## 2013-06-16 LAB — PROTIME-INR
INR: 0.95 (ref 0.00–1.49)
Prothrombin Time: 12.5 seconds (ref 11.6–15.2)

## 2013-06-16 LAB — COMPREHENSIVE METABOLIC PANEL
ALT: 43 U/L (ref 0–53)
AST: 35 U/L (ref 0–37)
Albumin: 4.4 g/dL (ref 3.5–5.2)
Alkaline Phosphatase: 69 U/L (ref 39–117)
BUN: 19 mg/dL (ref 6–23)
Chloride: 103 mEq/L (ref 96–112)
Potassium: 3.6 mEq/L (ref 3.5–5.1)
Sodium: 140 mEq/L (ref 135–145)
Total Bilirubin: 0.9 mg/dL (ref 0.3–1.2)

## 2013-06-16 LAB — URINALYSIS, ROUTINE W REFLEX MICROSCOPIC
Bilirubin Urine: NEGATIVE
Hgb urine dipstick: NEGATIVE
Ketones, ur: 15 mg/dL — AB
Nitrite: NEGATIVE
Urobilinogen, UA: 0.2 mg/dL (ref 0.0–1.0)

## 2013-06-16 LAB — MRSA PCR SCREENING: MRSA by PCR: NEGATIVE

## 2013-06-16 MED ORDER — BACITRACIN 500 UNIT/GM EX OINT
1.0000 "application " | TOPICAL_OINTMENT | Freq: Once | CUTANEOUS | Status: AC
  Administered 2013-06-16: 1 via TOPICAL
  Filled 2013-06-16: qty 0.9

## 2013-06-16 MED ORDER — MORPHINE SULFATE 2 MG/ML IJ SOLN
2.0000 mg | INTRAMUSCULAR | Status: DC | PRN
  Administered 2013-06-16: 2 mg via INTRAVENOUS
  Filled 2013-06-16: qty 1

## 2013-06-16 MED ORDER — ONDANSETRON HCL 4 MG/2ML IJ SOLN
4.0000 mg | Freq: Once | INTRAMUSCULAR | Status: AC
  Administered 2013-06-16: 4 mg via INTRAVENOUS
  Filled 2013-06-16: qty 2

## 2013-06-16 MED ORDER — ONDANSETRON HCL 4 MG/2ML IJ SOLN: 4.0000 mg | Freq: Four times a day (QID) | INTRAMUSCULAR | Status: DC | PRN

## 2013-06-16 MED ORDER — BACITRACIN-NEOMYCIN-POLYMYXIN OINTMENT TUBE
TOPICAL_OINTMENT | Freq: Every day | CUTANEOUS | Status: DC
  Administered 2013-06-16: 18:00:00 via TOPICAL
  Filled 2013-06-16: qty 15

## 2013-06-16 MED ORDER — MORPHINE SULFATE 2 MG/ML IJ SOLN: 1.0000 mg | INTRAMUSCULAR | Status: DC | PRN

## 2013-06-16 MED ORDER — MORPHINE SULFATE 4 MG/ML IJ SOLN: 4.0000 mg | INTRAMUSCULAR | Status: DC | PRN

## 2013-06-16 MED ORDER — ONDANSETRON HCL 4 MG PO TABS: 4.0000 mg | ORAL_TABLET | Freq: Four times a day (QID) | ORAL | Status: DC | PRN

## 2013-06-16 NOTE — ED Notes (Addendum)
Per pt; [phone] wife Zella Ball called to let her know he is here. No answer. Message left.

## 2013-06-16 NOTE — ED Notes (Signed)
Neurosurgeon at bedside °

## 2013-06-16 NOTE — ED Notes (Signed)
Pt reports tetanus up to date.  
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
## 2013-06-16 NOTE — ED Notes (Signed)
Family at bedside. 

## 2013-06-16 NOTE — Progress Notes (Signed)
UR completed.  Camaron Cammack, RN BSN [facility] CCM Trauma/Neuro ICU Case Manager [phone]  
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
## 2013-06-16 NOTE — ED Notes (Signed)
Patient transported to X-ray 

## 2013-06-16 NOTE — ED Notes (Signed)
MD at bedside with RN to discuss results of CT scan; MD removed c- collar. Phlebotomy at bedside; Family has no questions at this time. Water given to wife; pt instructed not to eat or drink.
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
## 2013-06-16 NOTE — ED Notes (Signed)
[place] PA at bedside.

## 2013-06-16 NOTE — ED Notes (Signed)
Patient transported to CT 

## 2013-06-16 NOTE — ED Notes (Signed)
Wife at bedside.

## 2013-06-16 NOTE — ED Notes (Signed)
Pt returned from CT; pt vomited clear liquids in CT scanner; pt still nauseated.

## 2013-06-16 NOTE — H&P (Signed)
I have seen and examined the patient and agree with the assessment and plans. Abdomen benign on exam  Corinne Goucher A. Magnus Ivan  MD, FACS

## 2013-06-16 NOTE — ED Notes (Signed)
PT returned from xray

## 2013-06-17 DIAGNOSIS — S060X9A Concussion with loss of consciousness of unspecified duration, initial encounter: Secondary | ICD-10-CM | POA: Diagnosis not present

## 2013-06-17 DIAGNOSIS — S20219A Contusion of unspecified front wall of thorax, initial encounter: Secondary | ICD-10-CM | POA: Diagnosis not present

## 2013-06-17 DIAGNOSIS — S066X9A Traumatic subarachnoid hemorrhage with loss of consciousness of unspecified duration, initial encounter: Secondary | ICD-10-CM | POA: Diagnosis not present

## 2013-06-17 LAB — CBC
Hemoglobin: 15.8 g/dL (ref 13.0–17.0)
MCHC: 34.9 g/dL (ref 30.0–36.0)
RBC: 4.72 MIL/uL (ref 4.22–5.81)
WBC: 8.1 10*3/uL (ref 4.0–10.5)

## 2013-06-17 LAB — BASIC METABOLIC PANEL
BUN: 15 mg/dL (ref 6–23)
Chloride: 105 mEq/L (ref 96–112)
GFR calc non Af Amer: 86 mL/min — ABNORMAL LOW (ref >90)
Glucose, Bld: 93 mg/dL (ref 70–99)
Potassium: 3.8 mEq/L (ref 3.5–5.1)
Sodium: 139 mEq/L (ref 135–145)

## 2013-06-17 MED ORDER — HYDROCODONE-ACETAMINOPHEN 5-325 MG PO TABS: 1.0000 | ORAL_TABLET | Freq: Four times a day (QID) | ORAL | Status: DC | PRN

## 2013-06-17 MED ORDER — BACITRACIN-NEOMYCIN-POLYMYXIN OINTMENT TUBE: 1.0000 "application " | TOPICAL_OINTMENT | Freq: Two times a day (BID) | CUTANEOUS | Status: AC
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
## 2013-06-17 NOTE — Progress Notes (Signed)
Patient ID: Andrew Knight, male   DOB: 05-Feb-1946, 67 y.o.   MRN: [id number] Afeb, vss Awake, alert,conversant No neuro issues Repeat scan shows no change in tiny convexity blood From my standpoint, patient can be released with out patient follow up with Dr Conchita Paris as needed.

## 2013-06-22 DIAGNOSIS — S0990XA Unspecified injury of head, initial encounter: Secondary | ICD-10-CM | POA: Diagnosis not present

## 2013-06-22 DIAGNOSIS — Z0000 Encounter for general adult medical examination without abnormal findings: Secondary | ICD-10-CM | POA: Diagnosis not present

## 2013-06-22 DIAGNOSIS — I619 Nontraumatic intracerebral hemorrhage, unspecified: Secondary | ICD-10-CM | POA: Diagnosis not present

## 2013-06-22 DIAGNOSIS — S060X9A Concussion with loss of consciousness of unspecified duration, initial encounter: Secondary | ICD-10-CM | POA: Diagnosis not present

## 2013-06-22 DIAGNOSIS — H2589 Other age-related cataract: Secondary | ICD-10-CM | POA: Diagnosis not present

## 2013-06-23 ENCOUNTER — Other Ambulatory Visit: Payer: Self-pay | Admitting: Internal Medicine

## 2013-06-23 DIAGNOSIS — S060X0A Concussion without loss of consciousness, initial encounter: Secondary | ICD-10-CM

## 2013-06-28 ENCOUNTER — Ambulatory Visit
Admission: RE | Admit: 2013-06-28 | Discharge: 2013-06-28 | Disposition: A | Discharge status: Home / Self Care | Payer: Medicare Other | Source: Ambulatory Visit | Attending: Internal Medicine | Admitting: Internal Medicine

## 2013-06-28 DIAGNOSIS — S060X9A Concussion with loss of consciousness of unspecified duration, initial encounter: Secondary | ICD-10-CM | POA: Diagnosis not present

## 2013-06-28 DIAGNOSIS — S060X0A Concussion without loss of consciousness, initial encounter: Secondary | ICD-10-CM

## 2013-06-29 DIAGNOSIS — S060X9A Concussion with loss of consciousness of unspecified duration, initial encounter: Secondary | ICD-10-CM | POA: Diagnosis not present

## 2013-06-29 DIAGNOSIS — I619 Nontraumatic intracerebral hemorrhage, unspecified: Secondary | ICD-10-CM | POA: Diagnosis not present

## 2013-06-29 DIAGNOSIS — IMO0001 Reserved for inherently not codable concepts without codable children: Secondary | ICD-10-CM | POA: Diagnosis not present

## 2013-07-01 DIAGNOSIS — S06330A Contusion and laceration of cerebrum, unspecified, without loss of consciousness, initial encounter: Secondary | ICD-10-CM | POA: Diagnosis not present

## 2013-07-01 DIAGNOSIS — E669 Obesity, unspecified: Secondary | ICD-10-CM | POA: Diagnosis not present

## 2013-07-02 DIAGNOSIS — R269 Unspecified abnormalities of gait and mobility: Secondary | ICD-10-CM | POA: Diagnosis not present

## 2013-07-02 DIAGNOSIS — S06330A Contusion and laceration of cerebrum, unspecified, without loss of consciousness, initial encounter: Secondary | ICD-10-CM | POA: Diagnosis not present

## 2013-07-07 DIAGNOSIS — R269 Unspecified abnormalities of gait and mobility: Secondary | ICD-10-CM | POA: Diagnosis not present

## 2013-07-07 DIAGNOSIS — S06330A Contusion and laceration of cerebrum, unspecified, without loss of consciousness, initial encounter: Secondary | ICD-10-CM | POA: Diagnosis not present

## 2013-07-09 DIAGNOSIS — M66329 Spontaneous rupture of flexor tendons, unspecified upper arm: Secondary | ICD-10-CM | POA: Diagnosis not present

## 2013-07-09 DIAGNOSIS — M75120 Complete rotator cuff tear or rupture of unspecified shoulder, not specified as traumatic: Secondary | ICD-10-CM | POA: Diagnosis not present

## 2013-07-12 DIAGNOSIS — R269 Unspecified abnormalities of gait and mobility: Secondary | ICD-10-CM | POA: Diagnosis not present

## 2013-07-12 DIAGNOSIS — S06330A Contusion and laceration of cerebrum, unspecified, without loss of consciousness, initial encounter: Secondary | ICD-10-CM | POA: Diagnosis not present

## 2013-07-14 DIAGNOSIS — R269 Unspecified abnormalities of gait and mobility: Secondary | ICD-10-CM | POA: Diagnosis not present

## 2013-07-14 DIAGNOSIS — S06330A Contusion and laceration of cerebrum, unspecified, without loss of consciousness, initial encounter: Secondary | ICD-10-CM | POA: Diagnosis not present

## 2013-07-23 DIAGNOSIS — R269 Unspecified abnormalities of gait and mobility: Secondary | ICD-10-CM | POA: Diagnosis not present

## 2013-07-23 DIAGNOSIS — S06330A Contusion and laceration of cerebrum, unspecified, without loss of consciousness, initial encounter: Secondary | ICD-10-CM | POA: Diagnosis not present

## 2013-07-26 DIAGNOSIS — R269 Unspecified abnormalities of gait and mobility: Secondary | ICD-10-CM | POA: Diagnosis not present

## 2013-07-26 DIAGNOSIS — S06330A Contusion and laceration of cerebrum, unspecified, without loss of consciousness, initial encounter: Secondary | ICD-10-CM | POA: Diagnosis not present

## 2013-07-29 DIAGNOSIS — S06330A Contusion and laceration of cerebrum, unspecified, without loss of consciousness, initial encounter: Secondary | ICD-10-CM | POA: Diagnosis not present

## 2013-07-29 DIAGNOSIS — R269 Unspecified abnormalities of gait and mobility: Secondary | ICD-10-CM | POA: Diagnosis not present

## 2013-08-02 DIAGNOSIS — R269 Unspecified abnormalities of gait and mobility: Secondary | ICD-10-CM | POA: Diagnosis not present

## 2013-08-05 DIAGNOSIS — H40019 Open angle with borderline findings, low risk, unspecified eye: Secondary | ICD-10-CM | POA: Diagnosis not present

## 2013-08-05 DIAGNOSIS — H532 Diplopia: Secondary | ICD-10-CM | POA: Diagnosis not present

## 2013-08-05 DIAGNOSIS — R269 Unspecified abnormalities of gait and mobility: Secondary | ICD-10-CM | POA: Diagnosis not present

## 2013-08-05 DIAGNOSIS — H2589 Other age-related cataract: Secondary | ICD-10-CM | POA: Diagnosis not present

## 2013-08-05 DIAGNOSIS — S06330A Contusion and laceration of cerebrum, unspecified, without loss of consciousness, initial encounter: Secondary | ICD-10-CM | POA: Diagnosis not present

## 2013-08-10 DIAGNOSIS — R269 Unspecified abnormalities of gait and mobility: Secondary | ICD-10-CM | POA: Diagnosis not present

## 2013-08-10 DIAGNOSIS — S06330A Contusion and laceration of cerebrum, unspecified, without loss of consciousness, initial encounter: Secondary | ICD-10-CM | POA: Diagnosis not present

## 2013-08-12 DIAGNOSIS — R269 Unspecified abnormalities of gait and mobility: Secondary | ICD-10-CM | POA: Diagnosis not present

## 2013-08-16 DIAGNOSIS — R269 Unspecified abnormalities of gait and mobility: Secondary | ICD-10-CM | POA: Diagnosis not present

## 2013-08-16 DIAGNOSIS — S06330A Contusion and laceration of cerebrum, unspecified, without loss of consciousness, initial encounter: Secondary | ICD-10-CM | POA: Diagnosis not present

## 2013-08-18 DIAGNOSIS — R269 Unspecified abnormalities of gait and mobility: Secondary | ICD-10-CM | POA: Diagnosis not present

## 2013-08-18 DIAGNOSIS — S06330A Contusion and laceration of cerebrum, unspecified, without loss of consciousness, initial encounter: Secondary | ICD-10-CM | POA: Diagnosis not present

## 2013-08-27 DIAGNOSIS — R269 Unspecified abnormalities of gait and mobility: Secondary | ICD-10-CM | POA: Diagnosis not present

## 2013-08-27 DIAGNOSIS — S06330A Contusion and laceration of cerebrum, unspecified, without loss of consciousness, initial encounter: Secondary | ICD-10-CM | POA: Diagnosis not present

## 2013-08-30 DIAGNOSIS — R269 Unspecified abnormalities of gait and mobility: Secondary | ICD-10-CM | POA: Diagnosis not present

## 2013-08-30 DIAGNOSIS — S06330A Contusion and laceration of cerebrum, unspecified, without loss of consciousness, initial encounter: Secondary | ICD-10-CM | POA: Diagnosis not present

## 2013-09-01 DIAGNOSIS — L723 Sebaceous cyst: Secondary | ICD-10-CM | POA: Diagnosis not present

## 2013-09-01 DIAGNOSIS — Z85828 Personal history of other malignant neoplasm of skin: Secondary | ICD-10-CM | POA: Diagnosis not present

## 2013-09-01 DIAGNOSIS — L821 Other seborrheic keratosis: Secondary | ICD-10-CM | POA: Diagnosis not present

## 2013-09-01 DIAGNOSIS — L219 Seborrheic dermatitis, unspecified: Secondary | ICD-10-CM | POA: Diagnosis not present

## 2013-09-02 DIAGNOSIS — R269 Unspecified abnormalities of gait and mobility: Secondary | ICD-10-CM | POA: Diagnosis not present

## 2013-09-02 DIAGNOSIS — S06330A Contusion and laceration of cerebrum, unspecified, without loss of consciousness, initial encounter: Secondary | ICD-10-CM | POA: Diagnosis not present

## 2013-09-06 DIAGNOSIS — S06330A Contusion and laceration of cerebrum, unspecified, without loss of consciousness, initial encounter: Secondary | ICD-10-CM | POA: Diagnosis not present

## 2013-09-06 DIAGNOSIS — R269 Unspecified abnormalities of gait and mobility: Secondary | ICD-10-CM | POA: Diagnosis not present

## 2013-09-15 DIAGNOSIS — H2589 Other age-related cataract: Secondary | ICD-10-CM | POA: Diagnosis not present

## 2013-09-15 DIAGNOSIS — H40019 Open angle with borderline findings, low risk, unspecified eye: Secondary | ICD-10-CM | POA: Diagnosis not present

## 2013-09-15 DIAGNOSIS — H532 Diplopia: Secondary | ICD-10-CM | POA: Diagnosis not present

## 2013-09-30 DIAGNOSIS — R030 Elevated blood-pressure reading, without diagnosis of hypertension: Secondary | ICD-10-CM | POA: Diagnosis not present

## 2013-09-30 DIAGNOSIS — Z6828 Body mass index (BMI) 28.0-28.9, adult: Secondary | ICD-10-CM | POA: Diagnosis not present

## 2013-09-30 DIAGNOSIS — S06330A Contusion and laceration of cerebrum, unspecified, without loss of consciousness, initial encounter: Secondary | ICD-10-CM | POA: Diagnosis not present

## 2013-11-08 DIAGNOSIS — R5381 Other malaise: Secondary | ICD-10-CM | POA: Diagnosis not present

## 2013-11-08 DIAGNOSIS — R5383 Other fatigue: Secondary | ICD-10-CM | POA: Diagnosis not present

## 2013-11-08 DIAGNOSIS — E7800 Pure hypercholesterolemia, unspecified: Secondary | ICD-10-CM | POA: Diagnosis not present

## 2013-11-11 DIAGNOSIS — Z1212 Encounter for screening for malignant neoplasm of rectum: Secondary | ICD-10-CM | POA: Diagnosis not present

## 2013-11-11 DIAGNOSIS — Z125 Encounter for screening for malignant neoplasm of prostate: Secondary | ICD-10-CM | POA: Diagnosis not present

## 2013-11-11 DIAGNOSIS — R1032 Left lower quadrant pain: Secondary | ICD-10-CM | POA: Diagnosis not present

## 2013-11-11 DIAGNOSIS — R7309 Other abnormal glucose: Secondary | ICD-10-CM | POA: Diagnosis not present

## 2013-11-11 DIAGNOSIS — R972 Elevated prostate specific antigen [PSA]: Secondary | ICD-10-CM | POA: Diagnosis not present

## 2013-12-15 ENCOUNTER — Encounter (INDEPENDENT_AMBULATORY_CARE_PROVIDER_SITE_OTHER): Payer: Self-pay | Admitting: Surgery

## 2013-12-15 ENCOUNTER — Ambulatory Visit (INDEPENDENT_AMBULATORY_CARE_PROVIDER_SITE_OTHER): Payer: Medicare Other | Admitting: Surgery

## 2013-12-15 VITALS — BP 124/84 | HR 80 | Temp 97.5°F | Resp 16 | Ht 71.0 in | Wt 194.0 lb

## 2013-12-15 DIAGNOSIS — N451 Epididymitis: Secondary | ICD-10-CM | POA: Insufficient documentation

## 2013-12-15 DIAGNOSIS — N453 Epididymo-orchitis: Secondary | ICD-10-CM | POA: Diagnosis not present

## 2013-12-15 MED ORDER — DOXYCYCLINE HYCLATE 100 MG PO CAPS: 100.0000 mg | ORAL_CAPSULE | Freq: Two times a day (BID) | ORAL | Status: DC

## 2013-12-15 MED ORDER — NAPROXEN 500 MG PO TABS: 500.0000 mg | ORAL_TABLET | Freq: Two times a day (BID) | ORAL | Status: AC
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
## 2013-12-15 NOTE — Patient Instructions (Signed)
Epididymitis  Epididymitis is a swelling (inflammation) of the epididymis. The epididymis is a cord-like structure along the back part of the testicle. Epididymitis is usually, but not always, caused by infection. This is usually a sudden problem beginning with chills, fever and pain behind the scrotum and in the testicle. There may be swelling and redness of the testicle.  DIAGNOSIS   Physical examination will reveal a tender, swollen epididymis. Sometimes, cultures are obtained from the urine or from prostate secretions to help find out if there is an infection or if the cause is a different problem. Sometimes, blood work is performed to see if your white blood cell count is elevated and if a germ (bacterial) or viral infection is present. Using this knowledge, an appropriate medicine which kills germs (antibiotic) can be chosen by your caregiver. A viral infection causing epididymitis will most often go away (resolve) without treatment.  HOME CARE INSTRUCTIONS   · Hot sitz baths for 20 minutes, 4 times per day, may help relieve pain.  · Only take over-the-counter or prescription medicines for pain, discomfort or fever as directed by your caregiver.  · Take all medicines, including antibiotics, as directed. Take the antibiotics for the full prescribed length of time even if you are feeling better.  · It is very important to keep all follow-up appointments.  SEEK IMMEDIATE MEDICAL CARE IF:   · You have a fever.  · You have pain not relieved with medicines.  · You have any worsening of your problems.  · Your pain seems to come and go.  · You develop pain, redness, and swelling in the scrotum and surrounding areas.  MAKE SURE YOU:   · Understand these instructions.  · Will watch your condition.  · Will get help right away if you are not doing well or get worse.  Document Released: 07/05/2000 Document Revised: 09/30/2011 Document Reviewed: 05/25/2009  Exit[employer]® Patient Information ©2014 [employer].

## 2013-12-22 ENCOUNTER — Encounter (INDEPENDENT_AMBULATORY_CARE_PROVIDER_SITE_OTHER): Payer: Self-pay

## 2013-12-29 DIAGNOSIS — H353190 Nonexudative age-related macular degeneration, unspecified eye, stage unspecified: Secondary | ICD-10-CM | POA: Diagnosis not present

## 2013-12-29 DIAGNOSIS — H43819 Vitreous degeneration, unspecified eye: Secondary | ICD-10-CM | POA: Diagnosis not present

## 2013-12-29 DIAGNOSIS — H35359 Cystoid macular degeneration, unspecified eye: Secondary | ICD-10-CM | POA: Diagnosis not present

## 2014-02-03 DIAGNOSIS — H2589 Other age-related cataract: Secondary | ICD-10-CM | POA: Diagnosis not present

## 2014-02-03 DIAGNOSIS — H40019 Open angle with borderline findings, low risk, unspecified eye: Secondary | ICD-10-CM | POA: Diagnosis not present

## 2014-02-08 ENCOUNTER — Encounter (INDEPENDENT_AMBULATORY_CARE_PROVIDER_SITE_OTHER): Payer: Self-pay | Admitting: Surgery

## 2014-02-08 ENCOUNTER — Ambulatory Visit (INDEPENDENT_AMBULATORY_CARE_PROVIDER_SITE_OTHER): Payer: Medicare Other | Admitting: Surgery

## 2014-02-08 VITALS — BP 134/76 | HR 84 | Temp 97.1°F | Ht 70.0 in | Wt 191.0 lb

## 2014-02-08 DIAGNOSIS — N453 Epididymo-orchitis: Secondary | ICD-10-CM

## 2014-02-08 DIAGNOSIS — N451 Epididymitis: Secondary | ICD-10-CM

## 2014-02-08 NOTE — Progress Notes (Signed)
General Surgery [facility]  Chief Complaint  Patient presents with  . Follow-up    re-evaluate for epidydimitis and inguinal hernia, left - referral from Dr. Tommy Medal    HISTORY: Patient is a 68 year old male previously evaluated for groin pain and testicular pain. He was treated with a course of Naprosyn and doxycycline for presumed epididymitis. He returns today for evaluation for left inguinal hernia and to discuss hernia repair.  Patient noted improvement in his symptoms following his treatment with Naprosyn and doxycycline. He now has had some recurrence of bilateral testicular discomfort.  PERTINENT REVIEW OF SYSTEMS: Denies signs or symptoms of obstruction  EXAM: HEENT: normocephalic; pupils equal and reactive; sclerae clear; dentition good; mucous membranes moist NECK:  symmetric on extension; no palpable anterior or posterior cervical lymphadenopathy; no supraclavicular masses; no tenderness CHEST: clear to auscultation bilaterally without rales, rhonchi, or wheezes CARDIAC: regular rate and rhythm without significant murmur; peripheral pulses are full GU:  Normal male without mass or lesion; palpation in the right inguinal canal shows no sign of recurrent hernia; well-healed incision on the right side; right testicular tenderness, moderate. Left testicle is much improved from prior exam with minimal tenderness. Palpation in the left inguinal canal with cough and Valsalva shows a small probably direct inguinal hernia which is reducible. This is mildly tender. EXT:  non-tender without edema; no deformity NEURO: no gross focal deficits; no sign of tremor   IMPRESSION: #1 left inguinal hernia, reducible, symptomatic #2 epididymitis, resolved #3 right testicular tenderness of undetermined etiology  PLAN: I discussed the above findings with the patient and his wife. I believe we should proceed with left inguinal hernia repair with mesh. We discussed the  risk and benefits of the procedure. We discussed restrictions on his activities following surgery. Patient understands and wishes to proceed. We'll make arrangements for outpatient surgery in the near future.  The risks and benefits of the procedure have been discussed at length with the patient.  The patient understands the proposed procedure, potential alternative treatments, and the course of recovery to be expected.  All of the patient's questions have been answered at this time.  The patient wishes to proceed with surgery.  Earnstine Regal, MD, [facility] Office: [phone]  Visit Diagnoses: 1. Epididymitis, left

## 2014-02-08 NOTE — Patient Instructions (Signed)
[facility]  HERNIA REPAIR POST OP INSTRUCTIONS  Always review your discharge instruction sheet given to you by the facility where your surgery was performed.  1. A  prescription for pain medication may be given to you upon discharge.  Take your pain medication as prescribed.  If narcotic pain medicine is not needed, then you may take acetaminophen (Tylenol) or ibuprofen (Advil) as needed.  2. Take your usually prescribed medications unless otherwise directed.  3. If you need a refill on your pain medication, please contact your pharmacy.  They will contact our office to request authorization. Prescriptions will not be filled after 5 pm daily or on weekends.  4. You should follow a light diet the first 24 hours after arrival home, such as soup and crackers or toast.  Be sure to include plenty of fluids daily.  Resume your normal diet the day after surgery.  5. Most patients will experience some swelling and bruising around the surgical site.  Ice packs and reclining will help.  Swelling and bruising can take several days to resolve.   6. It is common to experience some constipation if taking pain medication after surgery.  Increasing fluid intake and taking a stool softener (such as Colace) will usually help or prevent this problem from occurring.  A mild laxative (Milk of Magnesia or Miralax) should be taken according to package directions if there are no bowel movements after 48 hours.  7. Unless discharge instructions indicate otherwise, you may remove your bandages 24-48 hours after surgery, and you may shower at that time.  You may have steri-strips (small skin tapes) in place directly over the incision.  These strips should be left on the skin for 7-10 days.  If your surgeon used skin glue on the incision, you may shower in 24 hours.  The glue will flake off over the next 2-3 weeks.  Any sutures or staples will be removed at the office during your follow-up  visit.  8. ACTIVITIES:  You may resume regular (light) daily activities beginning the next day-such as daily self-care, walking, climbing stairs-gradually increasing activities as tolerated.  You may have sexual intercourse when it is comfortable.  Refrain from any heavy lifting or straining until approved by your doctor.  You may drive when you are no longer taking prescription pain medication, you can comfortably wear a seatbelt, and you can safely maneuver your car and apply brakes.  9. You should see your doctor in the office for a follow-up appointment approximately 2-3 weeks after your surgery.  Make sure that you call for this appointment within a day or two after you arrive home to insure a convenient appointment time. 10.   WHEN TO CALL YOUR DOCTOR: 1. Fever greater than 101.0 2. Inability to urinate 3. Persistent nausea and/or vomiting 4. Extreme swelling or bruising 5. Continued bleeding from incision 6. Increased pain, redness, or drainage from the incision  The clinic staff is available to answer your questions during regular business hours.  Please don't hesitate to call and ask to speak to one of the nurses for clinical concerns.  If you have a medical emergency, go to the nearest emergency room or call 911.  A surgeon from [facility] is always on call for the hospital.   [facility] [address], [address], [place], [place]  [address]  [phone] ? [phone] ? FAX [phone]  [email or web address]   

## 2014-02-14 ENCOUNTER — Encounter (HOSPITAL_BASED_OUTPATIENT_CLINIC_OR_DEPARTMENT_OTHER): Payer: Self-pay | Admitting: *Deleted

## 2014-02-14 NOTE — Progress Notes (Signed)
No labs needed-no cardiac or resp problems  

## 2014-02-18 ENCOUNTER — Encounter (HOSPITAL_BASED_OUTPATIENT_CLINIC_OR_DEPARTMENT_OTHER)
Admission: RE | Disposition: A | Discharge status: Home / Self Care | Payer: Self-pay | Source: Ambulatory Visit | Attending: Surgery

## 2014-02-18 ENCOUNTER — Encounter (HOSPITAL_BASED_OUTPATIENT_CLINIC_OR_DEPARTMENT_OTHER): Payer: Self-pay | Admitting: *Deleted

## 2014-02-18 ENCOUNTER — Encounter (HOSPITAL_BASED_OUTPATIENT_CLINIC_OR_DEPARTMENT_OTHER): Payer: Medicare Other | Admitting: Certified Registered"

## 2014-02-18 ENCOUNTER — Ambulatory Visit (HOSPITAL_BASED_OUTPATIENT_CLINIC_OR_DEPARTMENT_OTHER)
Admission: RE | Admit: 2014-02-18 | Discharge: 2014-02-18 | Disposition: A | Discharge status: Home / Self Care | Payer: Medicare Other | Source: Ambulatory Visit | Attending: Surgery | Admitting: Surgery

## 2014-02-18 ENCOUNTER — Ambulatory Visit (HOSPITAL_BASED_OUTPATIENT_CLINIC_OR_DEPARTMENT_OTHER): Payer: Medicare Other | Admitting: Certified Registered"

## 2014-02-18 DIAGNOSIS — R109 Unspecified abdominal pain: Secondary | ICD-10-CM | POA: Diagnosis not present

## 2014-02-18 DIAGNOSIS — N509 Disorder of male genital organs, unspecified: Secondary | ICD-10-CM | POA: Diagnosis not present

## 2014-02-18 DIAGNOSIS — K4090 Unilateral inguinal hernia, without obstruction or gangrene, not specified as recurrent: Secondary | ICD-10-CM | POA: Diagnosis not present

## 2014-02-18 DIAGNOSIS — Z87891 Personal history of nicotine dependence: Secondary | ICD-10-CM | POA: Diagnosis not present

## 2014-02-18 DIAGNOSIS — G8918 Other acute postprocedural pain: Secondary | ICD-10-CM | POA: Diagnosis not present

## 2014-02-18 HISTORY — DX: Presence of spectacles and contact lenses: Z97.3

## 2014-02-18 HISTORY — PX: INSERTION OF MESH: SHX5868

## 2014-02-18 HISTORY — PX: INGUINAL HERNIA REPAIR: SHX194

## 2014-02-18 HISTORY — DX: Presence of external hearing-aid: Z97.4

## 2014-02-18 LAB — POCT HEMOGLOBIN-HEMACUE: HEMOGLOBIN: 17 g/dL (ref 13.0–17.0)

## 2014-02-18 SURGERY — REPAIR, HERNIA, INGUINAL, ADULT
Anesthesia: General | Laterality: Left

## 2014-02-18 MED ORDER — FENTANYL CITRATE 0.05 MG/ML IJ SOLN
50.0000 ug | INTRAMUSCULAR | Status: DC | PRN
  Administered 2014-02-18: 100 ug via INTRAVENOUS

## 2014-02-18 MED ORDER — OXYCODONE HCL 5 MG/5ML PO SOLN: 5.0000 mg | Freq: Once | ORAL | Status: AC | PRN

## 2014-02-18 MED ORDER — BUPIVACAINE HCL (PF) 0.5 % IJ SOLN
INTRAMUSCULAR | Status: AC
  Filled 2014-02-18: qty 30

## 2014-02-18 MED ORDER — FENTANYL CITRATE 0.05 MG/ML IJ SOLN
INTRAMUSCULAR | Status: AC
  Filled 2014-02-18: qty 2

## 2014-02-18 MED ORDER — FENTANYL CITRATE 0.05 MG/ML IJ SOLN
INTRAMUSCULAR | Status: DC | PRN
  Administered 2014-02-18 (×2): 25 ug via INTRAVENOUS
  Administered 2014-02-18: 100 ug via INTRAVENOUS
  Administered 2014-02-18: 25 ug via INTRAVENOUS

## 2014-02-18 MED ORDER — HYDROMORPHONE HCL PF 1 MG/ML IJ SOLN
0.2500 mg | INTRAMUSCULAR | Status: DC | PRN
  Administered 2014-02-18 (×2): 0.5 mg via INTRAVENOUS

## 2014-02-18 MED ORDER — EPHEDRINE SULFATE 50 MG/ML IJ SOLN
INTRAMUSCULAR | Status: DC | PRN
  Administered 2014-02-18: 20 mg via INTRAVENOUS

## 2014-02-18 MED ORDER — HYDROMORPHONE HCL PF 1 MG/ML IJ SOLN
INTRAMUSCULAR | Status: AC
  Filled 2014-02-18: qty 1

## 2014-02-18 MED ORDER — ONDANSETRON HCL 4 MG/2ML IJ SOLN
INTRAMUSCULAR | Status: DC | PRN
  Administered 2014-02-18: 4 mg via INTRAVENOUS

## 2014-02-18 MED ORDER — ONDANSETRON HCL 4 MG/2ML IJ SOLN: 4.0000 mg | Freq: Once | INTRAMUSCULAR | Status: DC | PRN

## 2014-02-18 MED ORDER — LACTATED RINGERS IV SOLN
INTRAVENOUS | Status: DC
  Administered 2014-02-18 (×2): via INTRAVENOUS

## 2014-02-18 MED ORDER — FENTANYL CITRATE 0.05 MG/ML IJ SOLN
INTRAMUSCULAR | Status: AC
  Filled 2014-02-18: qty 6

## 2014-02-18 MED ORDER — DEXAMETHASONE SODIUM PHOSPHATE 4 MG/ML IJ SOLN
INTRAMUSCULAR | Status: DC | PRN
  Administered 2014-02-18: 10 mg via INTRAVENOUS

## 2014-02-18 MED ORDER — BUPIVACAINE-EPINEPHRINE (PF) 0.5% -1:200000 IJ SOLN
INTRAMUSCULAR | Status: DC | PRN
  Administered 2014-02-18: 25 mL via PERINEURAL

## 2014-02-18 MED ORDER — HYDROCODONE-ACETAMINOPHEN 5-325 MG PO TABS: 1.0000 | ORAL_TABLET | ORAL | Status: DC | PRN

## 2014-02-18 MED ORDER — PROPOFOL 10 MG/ML IV BOLUS
INTRAVENOUS | Status: DC | PRN
  Administered 2014-02-18: 200 mg via INTRAVENOUS

## 2014-02-18 MED ORDER — OXYCODONE HCL 5 MG PO TABS
ORAL_TABLET | ORAL | Status: AC
  Filled 2014-02-18: qty 1

## 2014-02-18 MED ORDER — MIDAZOLAM HCL 2 MG/2ML IJ SOLN
1.0000 mg | INTRAMUSCULAR | Status: DC | PRN
  Administered 2014-02-18: 2 mg via INTRAVENOUS

## 2014-02-18 MED ORDER — CEFAZOLIN SODIUM-DEXTROSE 2-3 GM-% IV SOLR
2.0000 g | INTRAVENOUS | Status: AC
  Administered 2014-02-18: 2 g via INTRAVENOUS

## 2014-02-18 MED ORDER — OXYCODONE HCL 5 MG PO TABS
5.0000 mg | ORAL_TABLET | Freq: Once | ORAL | Status: AC | PRN
  Administered 2014-02-18: 5 mg via ORAL

## 2014-02-18 MED ORDER — BUPIVACAINE HCL (PF) 0.5 % IJ SOLN
INTRAMUSCULAR | Status: DC | PRN
  Administered 2014-02-18: 20 mL

## 2014-02-18 MED ORDER — CEFAZOLIN SODIUM-DEXTROSE 2-3 GM-% IV SOLR
INTRAVENOUS | Status: AC
  Filled 2014-02-18: qty 50

## 2014-02-18 MED ORDER — SUCCINYLCHOLINE CHLORIDE 20 MG/ML IJ SOLN
INTRAMUSCULAR | Status: AC
  Filled 2014-02-18: qty 1

## 2014-02-18 MED ORDER — LIDOCAINE HCL (CARDIAC) 20 MG/ML IV SOLN
INTRAVENOUS | Status: DC | PRN
  Administered 2014-02-18: 30 mg via INTRAVENOUS

## 2014-02-18 MED ORDER — MIDAZOLAM HCL 2 MG/2ML IJ SOLN
INTRAMUSCULAR | Status: AC
  Filled 2014-02-18: qty 2

## 2014-02-18 SURGICAL SUPPLY — 48 items
APL SKNCLS STERI-STRIP NONHPOA (GAUZE/BANDAGES/DRESSINGS) ×1
BENZOIN TINCTURE PRP APPL 2/3 (GAUZE/BANDAGES/DRESSINGS) ×3 IMPLANT
BLADE CLIPPER SURG (BLADE) ×3 IMPLANT
BLADE SURG 15 STRL LF DISP TIS (BLADE) ×1 IMPLANT
BLADE SURG 15 STRL SS (BLADE) ×3
CANISTER SUCT 1200ML W/VALVE (MISCELLANEOUS) IMPLANT
CHLORAPREP W/TINT 26ML (MISCELLANEOUS) ×3 IMPLANT
CLEANER CAUTERY TIP 5X5 PAD (MISCELLANEOUS) ×1 IMPLANT
CLOSURE WOUND 1/2 X4 (GAUZE/BANDAGES/DRESSINGS) ×1
COVER MAYO STAND STRL (DRAPES) ×3 IMPLANT
COVER TABLE BACK 60X90 (DRAPES) ×3 IMPLANT
DECANTER SPIKE VIAL GLASS SM (MISCELLANEOUS) IMPLANT
DRAIN PENROSE 1/2X12 LTX STRL (WOUND CARE) ×3 IMPLANT
DRAPE PED LAPAROTOMY (DRAPES) ×3 IMPLANT
DRAPE UTILITY XL STRL (DRAPES) ×3 IMPLANT
ELECT REM PT RETURN 9FT ADLT (ELECTROSURGICAL) ×3
ELECTRODE REM PT RTRN 9FT ADLT (ELECTROSURGICAL) ×1 IMPLANT
GLOVE BIOGEL PI IND STRL 7.0 (GLOVE) IMPLANT
GLOVE BIOGEL PI INDICATOR 7.0 (GLOVE) ×2
GLOVE ECLIPSE 6.5 STRL STRAW (GLOVE) ×2 IMPLANT
GLOVE SURG ORTHO 8.0 STRL STRW (GLOVE) ×3 IMPLANT
GOWN STRL REUS W/ TWL LRG LVL3 (GOWN DISPOSABLE) ×1 IMPLANT
GOWN STRL REUS W/ TWL XL LVL3 (GOWN DISPOSABLE) ×1 IMPLANT
GOWN STRL REUS W/TWL LRG LVL3 (GOWN DISPOSABLE) ×3
GOWN STRL REUS W/TWL XL LVL3 (GOWN DISPOSABLE) ×3
MESH ULTRAPRO 3X6 7.6X15CM (Mesh General) ×2 IMPLANT
NDL HYPO 25X1 1.5 SAFETY (NEEDLE) ×1 IMPLANT
NEEDLE HYPO 25X1 1.5 SAFETY (NEEDLE) ×3 IMPLANT
NS IRRIG 1000ML POUR BTL (IV SOLUTION) ×3 IMPLANT
PACK BASIN DAY SURGERY FS (CUSTOM PROCEDURE TRAY) ×3 IMPLANT
PAD CLEANER CAUTERY TIP 5X5 (MISCELLANEOUS) ×2
PENCIL BUTTON HOLSTER BLD 10FT (ELECTRODE) ×3 IMPLANT
SLEEVE SCD COMPRESS KNEE MED (MISCELLANEOUS) IMPLANT
SPONGE GAUZE 4X4 12PLY STER LF (GAUZE/BANDAGES/DRESSINGS) ×4 IMPLANT
STRIP CLOSURE SKIN 1/2X4 (GAUZE/BANDAGES/DRESSINGS) ×2 IMPLANT
SUT MNCRL AB 4-0 PS2 18 (SUTURE) ×3 IMPLANT
SUT NOVA NAB GS-22 2 0 T19 (SUTURE) ×6 IMPLANT
SUT SILK 2 0 SH (SUTURE) ×3 IMPLANT
SUT SILK 2 0 TIES 17X18 (SUTURE)
SUT SILK 2-0 18XBRD TIE BLK (SUTURE) IMPLANT
SUT VICRYL 3-0 CR8 SH (SUTURE) ×3 IMPLANT
SYRINGE CONTROL L 12CC (SYRINGE) ×3 IMPLANT
SYRINGE CONTROL LL 12CC (SYRINGE) ×1 IMPLANT
TOWEL OR 17X24 6PK STRL BLUE (TOWEL DISPOSABLE) ×6 IMPLANT
TOWEL OR NON WOVEN STRL DISP B (DISPOSABLE) ×3 IMPLANT
TUBE CONNECTING 20'X1/4 (TUBING)
TUBE CONNECTING 20X1/4 (TUBING) IMPLANT
YANKAUER SUCT BULB TIP NO VENT (SUCTIONS) IMPLANT

## 2014-02-18 NOTE — Transfer of Care (Signed)
Immediate Anesthesia Transfer of Care Note  Patient: Andrew Knight  Procedure(s) Performed: Procedure(s): LEFT INGUINAL HERNIA REPAIR (Left) INSERTION OF MESH (Left)  Patient Location: PACU  Anesthesia Type:GA combined with regional for post-op pain  Level of Consciousness: awake, alert , oriented and patient cooperative  Airway & Oxygen Therapy: Patient Spontanous Breathing and Patient connected to face mask oxygen  Post-op Assessment: Report given to PACU RN and Post -op Vital signs reviewed and stable  Post vital signs: Reviewed and stable  Complications: No apparent anesthesia complications

## 2014-02-18 NOTE — Interval H&P Note (Signed)
History and Physical Interval Note:  02/18/2014 2:18 PM  Andrew Knight  has presented today for surgery, with the diagnosis of left inguinal hernia.  The various methods of treatment have been discussed with the patient and family. After consideration of risks, benefits and other options for treatment, the patient has consented to    Procedure(s): LEFT INGUINAL HERNIA REPAIR (Left) INSERTION OF MESH (Left) as a surgical intervention .    The patient's history has been reviewed, patient examined, no change in status, stable for surgery.  I have reviewed the patient's chart and labs.  Questions were answered to the patient's satisfaction.    Earnstine Regal, MD, [facility] Office: [place]

## 2014-02-18 NOTE — Anesthesia Postprocedure Evaluation (Signed)
  Anesthesia Post-op Note  Patient: Andrew Knight  Procedure(s) Performed: Procedure(s): LEFT INGUINAL HERNIA REPAIR (Left) INSERTION OF MESH (Left)  Patient Location: PACU  Anesthesia Type:GA combined with regional for post-op pain  Level of Consciousness: awake, alert  and oriented  Airway and Oxygen Therapy: Patient Spontanous Breathing and Patient connected to face mask oxygen  Post-op Pain: mild  Post-op Assessment: Post-op Vital signs reviewed  Post-op Vital Signs: Reviewed  Last Vitals:  Filed Vitals:   02/18/14 1630  BP: 141/80  Pulse: 75  Temp:   Resp: 9    Complications: No apparent anesthesia complications

## 2014-02-18 NOTE — Anesthesia Procedure Notes (Addendum)
Anesthesia Regional Block:  TAP block  Pre-Anesthetic Checklist: ,, timeout performed, Correct Patient, Correct Site, Correct Laterality, Correct Procedure, Correct Position, site marked, Risks and benefits discussed,  Surgical consent,  Pre-op evaluation,  At surgeon's request and post-op pain management  Laterality: Left and Lower  Prep: chloraprep       Needles:  Injection technique: Single-shot  Needle Type: Echogenic Needle     Needle Length: 9cm 9 cm Needle Gauge: 21 and 21 G    Additional Needles:  Procedures: ultrasound guided (picture in chart) TAP block Narrative:  Start time: 02/18/2014 1:28 PM End time: 02/18/2014 1:35 PM Injection made incrementally with aspirations every 5 mL.  Performed by: Personally  Anesthesiologist: Lorrene Reid, MD   Procedure Name: LMA Insertion Date/Time: 02/18/2014 2:33 PM Performed by: Demitria Hay, [place] Pre-anesthesia Checklist: Patient identified, Emergency Drugs available, Suction available and Patient being monitored Patient Re-evaluated:Patient Re-evaluated prior to inductionOxygen Delivery Method: Circle System Utilized Preoxygenation: Pre-oxygenation with 100% oxygen Intubation Type: IV induction Ventilation: Mask ventilation without difficulty LMA: LMA inserted LMA Size: 4.0 Number of attempts: 1 Airway Equipment and Method: bite block Placement Confirmation: positive ETCO2 Tube secured with: Tape Dental Injury: Teeth and Oropharynx as per pre-operative assessment     Procedure Name: Intubation Date/Time: 02/18/2014 3:04 PM Performed by: Jerre Diguglielmo, [place] Pre-anesthesia Checklist: Suction available, Patient being monitored, Patient identified and Emergency Drugs available Patient Re-evaluated:Patient Re-evaluated prior to inductionOxygen Delivery Method: Circle System Utilized Preoxygenation: Pre-oxygenation with 100% oxygen Intubation Type: IV induction Ventilation: Mask ventilation without difficulty Tube type:  Oral Number of attempts: 1 Airway Equipment and Method: stylet and Video-laryngoscopy Placement Confirmation: ETT inserted through vocal cords under direct vision,  positive ETCO2 and breath sounds checked- equal and bilateral Secured at: 21 cm Tube secured with: Tape Dental Injury: Teeth and Oropharynx as per pre-operative assessment  Comments: Patient with increased tension in abdomen surgeon request relaxation, LMA removed , glide scope in room, DL with mac 3 glottis anterior, DL with glide scope VC visualized 7.0 ett placed atraumatic =BBS, + ETCO2, dentition unchanged

## 2014-02-18 NOTE — Anesthesia Preprocedure Evaluation (Signed)
Anesthesia Evaluation  Patient identified by MRN, date of birth, ID band Patient awake    Reviewed: Allergy & Precautions, H&P , NPO status , Patient's Chart, lab work & pertinent test results  Airway Mallampati: I TM Distance: >3 FB Neck ROM: Full    Dental  (+) Teeth Intact, Dental Advisory Given   Pulmonary former smoker,  breath sounds clear to auscultation        Cardiovascular Rhythm:Regular Rate:Normal     Neuro/Psych    GI/Hepatic   Endo/Other    Renal/GU      Musculoskeletal   Abdominal   Peds  Hematology   Anesthesia Other Findings   Reproductive/Obstetrics                           Anesthesia Physical Anesthesia Plan  ASA: I  Anesthesia Plan: General   Post-op Pain Management:    Induction: Intravenous  Airway Management Planned: LMA and Oral ETT  Additional Equipment:   Intra-op Plan:   Post-operative Plan: Extubation in OR  Informed Consent: I have reviewed the patients History and Physical, chart, labs and discussed the procedure including the risks, benefits and alternatives for the proposed anesthesia with the patient or authorized representative who has indicated his/her understanding and acceptance.   Dental advisory given  Plan Discussed with: CRNA, Anesthesiologist and Surgeon  Anesthesia Plan Comments:         Anesthesia Quick Evaluation

## 2014-02-18 NOTE — Progress Notes (Signed)
Assisted Dr. Crews with left, ultrasound guided, transabdominal plane block. Side rails up, monitors on throughout procedure. See vital signs in flow sheet. Tolerated Procedure well. 

## 2014-02-18 NOTE — Discharge Instructions (Signed)

## 2014-02-18 NOTE — Op Note (Signed)
Inguinal Hernia, Open, Procedure Note  Pre-operative Diagnosis:  Left inguinal hernia, reducible  Post-operative Diagnosis: same  Surgeon:  Earnstine Regal, MD, FACS  Anesthesia:  General  Preparation:  Chlora-prep  Estimated Blood Loss: Minimal  Complications:  none  Indications: The patient presented with a left, reducible hernia.    Procedure Details  The patient was evaluated in the holding area. All of the patient's questions were answered and the proposed procedure was confirmed. The site of the procedure was properly marked. The patient was taken to the Operating Room, identified by name, and the procedure verified as inguinal hernia repair.  The patient was placed in the supine position and underwent induction of anesthesia. A "Time Out" was performed per routine. The lower abdomen and groin were prepped and draped in the usual aseptic fashion.  After ascertaining that an adequate level of anesthesia had been obtained, an incision was made in the groin with a #10 blade.  Dissection was carried through the subcutaneous tissues and hemostasis obtained with the electrocautery.  A Gelpi retractor was placed for exposure.  The external oblique fascia was incised in line with it's fibers and extended through the external inguinal ring.  The cord structures were dissected out of the inguinal canal and encircled with a Penrose drain.  The floor of the inguinal canal was dissected out.  There was laxity of the floor but no direct fascial defect.  The cord was explored and a moderate indirect hernia was present.  Sac was excised and a high ligation performed with 2-0 silk suture ligature.  The floor of the inguinal canal was reconstructed with Ethicon Ultrapro mesh cut to the appropriate dimensions.  It was secured to the pubic tubercle with a 2-0 Novafil suture and along the inguinal ligament with a running 2-0 Novafil suture.  Mesh was split to accommodate the cord structures.  The superior  margin of the mesh was secured to the transversalis and internal oblique musculature with interrupted 2-0 Novafil sutures.  The tails of the mesh were overlapped lateral to the cord structures and secured to the inguinal ligament with interrupted 2-0 Novafil sutures to recreate the internal inguinal ring.  Cord structures were returned to the inguinal canal.  Local anesthetic was infiltrated throughout the field.  External oblique fascia was closed with interrupted 3-0 Vicryl sutures.  Subcutaneous tissues were closed with interrupted 3-0 Vicryl sutures.  Skin was anesthetized with local anesthetic, and the skin edges were re-approximated with a running 4-0 Monocryl suture.  Wound was washed and dried and benzoin and steristrips were applied.  A gauze dressing was applied.  Instrument, sponge, and needle counts were correct prior to closure and at the conclusion of the case.  The patient tolerated the procedure well.  The patient was awakened from anesthesia and brought to the recovery room in stable condition.  Earnstine Regal, MD, [facility] Office: [phone]

## 2014-02-18 NOTE — H&P (View-Only) (Signed)
General [facility]  Chief Complaint  Patient presents with  . Follow-up    re-evaluate for epidydimitis and inguinal hernia, left - referral from Dr. Tommy Medal    HISTORY: Patient is a 68 year old male previously evaluated for groin pain and testicular pain. He was treated with a course of Naprosyn and doxycycline for presumed epididymitis. He returns today for evaluation for left inguinal hernia and to discuss hernia repair.  Patient noted improvement in his symptoms following his treatment with Naprosyn and doxycycline. He now has had some recurrence of bilateral testicular discomfort.  PERTINENT REVIEW OF SYSTEMS: Denies signs or symptoms of obstruction  EXAM: HEENT: normocephalic; pupils equal and reactive; sclerae clear; dentition good; mucous membranes moist NECK:  symmetric on extension; no palpable anterior or posterior cervical lymphadenopathy; no supraclavicular masses; no tenderness CHEST: clear to auscultation bilaterally without rales, rhonchi, or wheezes CARDIAC: regular rate and rhythm without significant murmur; peripheral pulses are full GU:  Normal male without mass or lesion; palpation in the right inguinal canal shows no sign of recurrent hernia; well-healed incision on the right side; right testicular tenderness, moderate. Left testicle is much improved from prior exam with minimal tenderness. Palpation in the left inguinal canal with cough and Valsalva shows a small probably direct inguinal hernia which is reducible. This is mildly tender. EXT:  non-tender without edema; no deformity NEURO: no gross focal deficits; no sign of tremor   IMPRESSION: #1 left inguinal hernia, reducible, symptomatic #2 epididymitis, resolved #3 right testicular tenderness of undetermined etiology  PLAN: I discussed the above findings with the patient and his wife. I believe we should proceed with left inguinal hernia repair with mesh. We discussed the  risk and benefits of the procedure. We discussed restrictions on his activities following surgery. Patient understands and wishes to proceed. We'll make arrangements for outpatient surgery in the near future.  The risks and benefits of the procedure have been discussed at length with the patient.  The patient understands the proposed procedure, potential alternative treatments, and the course of recovery to be expected.  All of the patient's questions have been answered at this time.  The patient wishes to proceed with surgery.  Earnstine Regal, MD, [facility] Office: [phone]  Visit Diagnoses: 1. Epididymitis, left

## 2014-02-21 ENCOUNTER — Encounter (HOSPITAL_BASED_OUTPATIENT_CLINIC_OR_DEPARTMENT_OTHER): Payer: Self-pay | Admitting: Surgery

## 2014-02-21 ENCOUNTER — Telehealth (INDEPENDENT_AMBULATORY_CARE_PROVIDER_SITE_OTHER): Payer: Self-pay

## 2014-02-21 NOTE — Telephone Encounter (Signed)
Pt home doing well. PO appt made. 

## 2014-03-01 ENCOUNTER — Telehealth (INDEPENDENT_AMBULATORY_CARE_PROVIDER_SITE_OTHER): Payer: Self-pay

## 2014-03-01 MED ORDER — HYDROCODONE-ACETAMINOPHEN 5-325 MG PO TABS: 1.0000 | ORAL_TABLET | ORAL | Status: DC | PRN

## 2014-03-01 NOTE — Telephone Encounter (Signed)
Refill request approved by Dr. Hassell Done for Hydrocodone

## 2014-03-01 NOTE — Telephone Encounter (Signed)
Pt s/p inguinal hernia repair on 02/18/14. Pt states that he would like to get a refill on his Norco 5/325mg . Pt rates his pain a 6 out of 10. Pt states that he only takes these at night and during the day he takes Ibuprofen. Informed pt that Dr Harlow Asa is out of the office, however I will send this to one of his partners for a refill request. Advised pt that we would contact him as soon as we received a response. Pt verbalized understanding and agrees with POC.

## 2014-03-01 NOTE — Addendum Note (Signed)
Addended by: Ivor Costa on: 03/01/2014 06:18 PM   Modules accepted: Orders

## 2014-03-07 ENCOUNTER — Ambulatory Visit (INDEPENDENT_AMBULATORY_CARE_PROVIDER_SITE_OTHER): Payer: Medicare Other | Admitting: Surgery

## 2014-03-07 ENCOUNTER — Encounter (INDEPENDENT_AMBULATORY_CARE_PROVIDER_SITE_OTHER): Payer: Self-pay | Admitting: Surgery

## 2014-03-07 VITALS — BP 122/78 | HR 75 | Temp 97.8°F | Ht 71.0 in | Wt 195.0 lb

## 2014-03-07 DIAGNOSIS — K4090 Unilateral inguinal hernia, without obstruction or gangrene, not specified as recurrent: Secondary | ICD-10-CM

## 2014-03-07 NOTE — Progress Notes (Signed)
General Surgery [facility]  Chief Complaint  Patient presents with  . Routine Post Op    LIH repair 02/18/2014    HISTORY: Patient is a 68 year old male who underwent repair of left inguinal hernia with mesh on 02/18/2014. Postoperative course has been uncomplicated.  EXAM: Surgical wound is healing nicely. Steri-Strips are removed in the office. No sign of infection. With cough and Valsalva there is no sign of recurrence.  IMPRESSION: Status post left inguinal hernia repair with mesh  PLAN: Patient will begin applying topical creams to his incision. He is restricted to 25 pounds lifting for the next 3 weeks.  Patient will return for surgical care as needed.  Earnstine Regal, MD, [facility]   Visit Diagnoses: 1. Reducible left inguinal hernia

## 2014-03-07 NOTE — Patient Instructions (Signed)
  CARE OF INCISION   Apply cocoa butter/vitamin E cream (Palmer's brand) to your incision 2 - 3 times daily.  Massage cream into incision for one minute with each application.  Use sunscreen (50 SPF or higher) for first 6 months after surgery if area is exposed to sun.  You may alternate Mederma or other scar reducing cream with cocoa butter cream if desired.       Jesper Stirewalt M. Frazer [place], MD, FACS      [facility]      Office: [phone]    

## 2014-03-16 DIAGNOSIS — D239 Other benign neoplasm of skin, unspecified: Secondary | ICD-10-CM | POA: Diagnosis not present

## 2014-03-16 DIAGNOSIS — L219 Seborrheic dermatitis, unspecified: Secondary | ICD-10-CM | POA: Diagnosis not present

## 2014-03-16 DIAGNOSIS — D485 Neoplasm of uncertain behavior of skin: Secondary | ICD-10-CM | POA: Diagnosis not present

## 2014-03-16 DIAGNOSIS — D2360 Other benign neoplasm of skin of unspecified upper limb, including shoulder: Secondary | ICD-10-CM | POA: Diagnosis not present

## 2014-03-16 DIAGNOSIS — L570 Actinic keratosis: Secondary | ICD-10-CM | POA: Diagnosis not present

## 2014-03-16 DIAGNOSIS — Z85828 Personal history of other malignant neoplasm of skin: Secondary | ICD-10-CM | POA: Diagnosis not present

## 2014-03-16 DIAGNOSIS — D2330 Other benign neoplasm of skin of unspecified part of face: Secondary | ICD-10-CM | POA: Diagnosis not present

## 2014-03-16 DIAGNOSIS — L821 Other seborrheic keratosis: Secondary | ICD-10-CM | POA: Diagnosis not present

## 2014-03-16 DIAGNOSIS — L723 Sebaceous cyst: Secondary | ICD-10-CM | POA: Diagnosis not present

## 2014-05-12 DIAGNOSIS — R7309 Other abnormal glucose: Secondary | ICD-10-CM | POA: Diagnosis not present

## 2014-05-12 DIAGNOSIS — R972 Elevated prostate specific antigen [PSA]: Secondary | ICD-10-CM | POA: Diagnosis not present

## 2014-06-10 DIAGNOSIS — Z23 Encounter for immunization: Secondary | ICD-10-CM | POA: Diagnosis not present

## 2014-08-16 DIAGNOSIS — H34831 Tributary (branch) retinal vein occlusion, right eye: Secondary | ICD-10-CM | POA: Diagnosis not present

## 2014-08-16 DIAGNOSIS — H2513 Age-related nuclear cataract, bilateral: Secondary | ICD-10-CM | POA: Diagnosis not present

## 2014-08-16 DIAGNOSIS — H40013 Open angle with borderline findings, low risk, bilateral: Secondary | ICD-10-CM | POA: Diagnosis not present

## 2014-08-31 DIAGNOSIS — F0781 Postconcussional syndrome: Secondary | ICD-10-CM | POA: Diagnosis not present

## 2014-11-24 DIAGNOSIS — Z125 Encounter for screening for malignant neoplasm of prostate: Secondary | ICD-10-CM | POA: Diagnosis not present

## 2014-11-24 DIAGNOSIS — Z0000 Encounter for general adult medical examination without abnormal findings: Secondary | ICD-10-CM | POA: Diagnosis not present

## 2014-11-24 DIAGNOSIS — E780 Pure hypercholesterolemia: Secondary | ICD-10-CM | POA: Diagnosis not present

## 2014-11-29 DIAGNOSIS — L814 Other melanin hyperpigmentation: Secondary | ICD-10-CM | POA: Diagnosis not present

## 2014-11-29 DIAGNOSIS — L821 Other seborrheic keratosis: Secondary | ICD-10-CM | POA: Diagnosis not present

## 2014-11-29 DIAGNOSIS — D2262 Melanocytic nevi of left upper limb, including shoulder: Secondary | ICD-10-CM | POA: Diagnosis not present

## 2014-11-29 DIAGNOSIS — Z85828 Personal history of other malignant neoplasm of skin: Secondary | ICD-10-CM | POA: Diagnosis not present

## 2014-11-29 DIAGNOSIS — L570 Actinic keratosis: Secondary | ICD-10-CM | POA: Diagnosis not present

## 2014-11-29 DIAGNOSIS — L218 Other seborrheic dermatitis: Secondary | ICD-10-CM | POA: Diagnosis not present

## 2014-12-01 DIAGNOSIS — R7309 Other abnormal glucose: Secondary | ICD-10-CM | POA: Diagnosis not present

## 2014-12-01 DIAGNOSIS — E780 Pure hypercholesterolemia: Secondary | ICD-10-CM | POA: Diagnosis not present

## 2014-12-01 DIAGNOSIS — J309 Allergic rhinitis, unspecified: Secondary | ICD-10-CM | POA: Diagnosis not present

## 2014-12-01 DIAGNOSIS — Z23 Encounter for immunization: Secondary | ICD-10-CM | POA: Diagnosis not present

## 2015-01-12 DIAGNOSIS — H34811 Central retinal vein occlusion, right eye: Secondary | ICD-10-CM | POA: Diagnosis not present

## 2015-01-12 DIAGNOSIS — H3531 Nonexudative age-related macular degeneration: Secondary | ICD-10-CM | POA: Diagnosis not present

## 2015-02-16 DIAGNOSIS — H40013 Open angle with borderline findings, low risk, bilateral: Secondary | ICD-10-CM | POA: Diagnosis not present

## 2015-02-16 DIAGNOSIS — H25813 Combined forms of age-related cataract, bilateral: Secondary | ICD-10-CM | POA: Diagnosis not present

## 2015-02-16 DIAGNOSIS — H34831 Tributary (branch) retinal vein occlusion, right eye: Secondary | ICD-10-CM | POA: Diagnosis not present
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
## 2015-06-18 DIAGNOSIS — Z23 Encounter for immunization: Secondary | ICD-10-CM | POA: Diagnosis not present

## 2015-08-29 DIAGNOSIS — C44629 Squamous cell carcinoma of skin of left upper limb, including shoulder: Secondary | ICD-10-CM | POA: Diagnosis not present

## 2015-08-29 DIAGNOSIS — L218 Other seborrheic dermatitis: Secondary | ICD-10-CM | POA: Diagnosis not present

## 2015-08-29 DIAGNOSIS — Z85828 Personal history of other malignant neoplasm of skin: Secondary | ICD-10-CM | POA: Diagnosis not present

## 2015-08-29 DIAGNOSIS — L814 Other melanin hyperpigmentation: Secondary | ICD-10-CM | POA: Diagnosis not present

## 2015-08-29 DIAGNOSIS — L821 Other seborrheic keratosis: Secondary | ICD-10-CM | POA: Diagnosis not present

## 2015-08-29 DIAGNOSIS — L720 Epidermal cyst: Secondary | ICD-10-CM | POA: Diagnosis not present

## 2015-08-29 DIAGNOSIS — D1801 Hemangioma of skin and subcutaneous tissue: Secondary | ICD-10-CM | POA: Diagnosis not present

## 2015-08-29 DIAGNOSIS — D485 Neoplasm of uncertain behavior of skin: Secondary | ICD-10-CM | POA: Diagnosis not present

## 2015-08-29 DIAGNOSIS — L570 Actinic keratosis: Secondary | ICD-10-CM | POA: Diagnosis not present

## 2015-09-06 DIAGNOSIS — C44629 Squamous cell carcinoma of skin of left upper limb, including shoulder: Secondary | ICD-10-CM | POA: Diagnosis not present

## 2015-09-06 DIAGNOSIS — Z85828 Personal history of other malignant neoplasm of skin: Secondary | ICD-10-CM | POA: Diagnosis not present

## 2015-11-29 DIAGNOSIS — E7800 Pure hypercholesterolemia, unspecified: Secondary | ICD-10-CM | POA: Diagnosis not present

## 2015-11-29 DIAGNOSIS — R7309 Other abnormal glucose: Secondary | ICD-10-CM | POA: Diagnosis not present

## 2015-11-29 DIAGNOSIS — Z0001 Encounter for general adult medical examination with abnormal findings: Secondary | ICD-10-CM | POA: Diagnosis not present

## 2015-12-06 DIAGNOSIS — Z125 Encounter for screening for malignant neoplasm of prostate: Secondary | ICD-10-CM | POA: Diagnosis not present

## 2015-12-06 DIAGNOSIS — E663 Overweight: Secondary | ICD-10-CM | POA: Diagnosis not present

## 2015-12-06 DIAGNOSIS — J309 Allergic rhinitis, unspecified: Secondary | ICD-10-CM | POA: Diagnosis not present

## 2015-12-06 DIAGNOSIS — Z0001 Encounter for general adult medical examination with abnormal findings: Secondary | ICD-10-CM | POA: Diagnosis not present

## 2015-12-06 DIAGNOSIS — E785 Hyperlipidemia, unspecified: Secondary | ICD-10-CM | POA: Diagnosis not present

## 2015-12-06 DIAGNOSIS — F17211 Nicotine dependence, cigarettes, in remission: Secondary | ICD-10-CM | POA: Diagnosis not present

## 2016-01-11 DIAGNOSIS — H348112 Central retinal vein occlusion, right eye, stable: Secondary | ICD-10-CM | POA: Diagnosis not present

## 2016-01-11 DIAGNOSIS — H35041 Retinal micro-aneurysms, unspecified, right eye: Secondary | ICD-10-CM | POA: Diagnosis not present

## 2016-01-11 DIAGNOSIS — H35372 Puckering of macula, left eye: Secondary | ICD-10-CM | POA: Diagnosis not present

## 2016-01-11 DIAGNOSIS — H43812 Vitreous degeneration, left eye: Secondary | ICD-10-CM | POA: Diagnosis not present

## 2016-01-11 DIAGNOSIS — H353132 Nonexudative age-related macular degeneration, bilateral, intermediate dry stage: Secondary | ICD-10-CM | POA: Diagnosis not present

## 2016-03-28 DIAGNOSIS — L821 Other seborrheic keratosis: Secondary | ICD-10-CM | POA: Diagnosis not present

## 2016-03-28 DIAGNOSIS — L218 Other seborrheic dermatitis: Secondary | ICD-10-CM | POA: Diagnosis not present

## 2016-03-28 DIAGNOSIS — D485 Neoplasm of uncertain behavior of skin: Secondary | ICD-10-CM | POA: Diagnosis not present

## 2016-03-28 DIAGNOSIS — Z85828 Personal history of other malignant neoplasm of skin: Secondary | ICD-10-CM | POA: Diagnosis not present

## 2016-03-28 DIAGNOSIS — L720 Epidermal cyst: Secondary | ICD-10-CM | POA: Diagnosis not present

## 2016-06-24 DIAGNOSIS — Z23 Encounter for immunization: Secondary | ICD-10-CM | POA: Diagnosis not present

## 2016-08-29 DIAGNOSIS — L720 Epidermal cyst: Secondary | ICD-10-CM | POA: Diagnosis not present

## 2016-08-29 DIAGNOSIS — Z85828 Personal history of other malignant neoplasm of skin: Secondary | ICD-10-CM | POA: Diagnosis not present

## 2016-08-29 DIAGNOSIS — L570 Actinic keratosis: Secondary | ICD-10-CM | POA: Diagnosis not present

## 2016-08-29 DIAGNOSIS — L218 Other seborrheic dermatitis: Secondary | ICD-10-CM | POA: Diagnosis not present

## 2016-08-29 DIAGNOSIS — D225 Melanocytic nevi of trunk: Secondary | ICD-10-CM | POA: Diagnosis not present

## 2016-08-29 DIAGNOSIS — L814 Other melanin hyperpigmentation: Secondary | ICD-10-CM | POA: Diagnosis not present

## 2016-08-29 DIAGNOSIS — L821 Other seborrheic keratosis: Secondary | ICD-10-CM | POA: Diagnosis not present

## 2016-08-29 DIAGNOSIS — D1801 Hemangioma of skin and subcutaneous tissue: Secondary | ICD-10-CM | POA: Diagnosis not present

## 2016-12-03 DIAGNOSIS — E785 Hyperlipidemia, unspecified: Secondary | ICD-10-CM | POA: Diagnosis not present

## 2016-12-03 DIAGNOSIS — Z0000 Encounter for general adult medical examination without abnormal findings: Secondary | ICD-10-CM | POA: Diagnosis not present

## 2016-12-03 DIAGNOSIS — E559 Vitamin D deficiency, unspecified: Secondary | ICD-10-CM | POA: Diagnosis not present

## 2016-12-03 DIAGNOSIS — Z125 Encounter for screening for malignant neoplasm of prostate: Secondary | ICD-10-CM | POA: Diagnosis not present

## 2016-12-06 DIAGNOSIS — E559 Vitamin D deficiency, unspecified: Secondary | ICD-10-CM | POA: Diagnosis not present

## 2016-12-06 DIAGNOSIS — Z1212 Encounter for screening for malignant neoplasm of rectum: Secondary | ICD-10-CM | POA: Diagnosis not present

## 2016-12-06 DIAGNOSIS — E7800 Pure hypercholesterolemia, unspecified: Secondary | ICD-10-CM | POA: Diagnosis not present

## 2016-12-06 DIAGNOSIS — R972 Elevated prostate specific antigen [PSA]: Secondary | ICD-10-CM | POA: Diagnosis not present

## 2017-03-11 DIAGNOSIS — L720 Epidermal cyst: Secondary | ICD-10-CM | POA: Diagnosis not present

## 2017-03-11 DIAGNOSIS — H353132 Nonexudative age-related macular degeneration, bilateral, intermediate dry stage: Secondary | ICD-10-CM | POA: Diagnosis not present

## 2017-03-11 DIAGNOSIS — D1801 Hemangioma of skin and subcutaneous tissue: Secondary | ICD-10-CM | POA: Diagnosis not present

## 2017-03-11 DIAGNOSIS — L570 Actinic keratosis: Secondary | ICD-10-CM | POA: Diagnosis not present

## 2017-03-11 DIAGNOSIS — H348111 Central retinal vein occlusion, right eye, with retinal neovascularization: Secondary | ICD-10-CM | POA: Diagnosis not present

## 2017-03-11 DIAGNOSIS — H211X1 Other vascular disorders of iris and ciliary body, right eye: Secondary | ICD-10-CM | POA: Diagnosis not present

## 2017-03-11 DIAGNOSIS — D2262 Melanocytic nevi of left upper limb, including shoulder: Secondary | ICD-10-CM | POA: Diagnosis not present

## 2017-03-11 DIAGNOSIS — Z85828 Personal history of other malignant neoplasm of skin: Secondary | ICD-10-CM | POA: Diagnosis not present

## 2017-03-11 DIAGNOSIS — H43812 Vitreous degeneration, left eye: Secondary | ICD-10-CM | POA: Diagnosis not present

## 2017-03-11 DIAGNOSIS — L821 Other seborrheic keratosis: Secondary | ICD-10-CM | POA: Diagnosis not present

## 2017-03-13 DIAGNOSIS — H348111 Central retinal vein occlusion, right eye, with retinal neovascularization: Secondary | ICD-10-CM | POA: Diagnosis not present

## 2017-04-03 DIAGNOSIS — H211X1 Other vascular disorders of iris and ciliary body, right eye: Secondary | ICD-10-CM | POA: Diagnosis not present

## 2017-04-03 DIAGNOSIS — H348111 Central retinal vein occlusion, right eye, with retinal neovascularization: Secondary | ICD-10-CM | POA: Diagnosis not present

## 2017-04-03 DIAGNOSIS — H353132 Nonexudative age-related macular degeneration, bilateral, intermediate dry stage: Secondary | ICD-10-CM | POA: Diagnosis not present

## 2017-05-02 DIAGNOSIS — H25813 Combined forms of age-related cataract, bilateral: Secondary | ICD-10-CM | POA: Diagnosis not present

## 2017-05-02 DIAGNOSIS — H40013 Open angle with borderline findings, low risk, bilateral: Secondary | ICD-10-CM | POA: Diagnosis not present

## 2017-05-02 DIAGNOSIS — H348312 Tributary (branch) retinal vein occlusion, right eye, stable: Secondary | ICD-10-CM | POA: Diagnosis not present

## 2017-05-15 DIAGNOSIS — H348111 Central retinal vein occlusion, right eye, with retinal neovascularization: Secondary | ICD-10-CM | POA: Diagnosis not present

## 2017-05-15 DIAGNOSIS — H43812 Vitreous degeneration, left eye: Secondary | ICD-10-CM | POA: Diagnosis not present

## 2017-05-15 DIAGNOSIS — H211X1 Other vascular disorders of iris and ciliary body, right eye: Secondary | ICD-10-CM | POA: Diagnosis not present

## 2017-05-15 DIAGNOSIS — H353132 Nonexudative age-related macular degeneration, bilateral, intermediate dry stage: Secondary | ICD-10-CM | POA: Diagnosis not present

## 2017-06-08 DIAGNOSIS — Z23 Encounter for immunization: Secondary | ICD-10-CM | POA: Diagnosis not present

## 2017-06-18 DIAGNOSIS — R972 Elevated prostate specific antigen [PSA]: Secondary | ICD-10-CM | POA: Diagnosis not present

## 2017-12-08 DIAGNOSIS — E7800 Pure hypercholesterolemia, unspecified: Secondary | ICD-10-CM | POA: Diagnosis not present

## 2017-12-08 DIAGNOSIS — E785 Hyperlipidemia, unspecified: Secondary | ICD-10-CM | POA: Diagnosis not present

## 2017-12-08 DIAGNOSIS — E559 Vitamin D deficiency, unspecified: Secondary | ICD-10-CM | POA: Diagnosis not present

## 2018-02-26 DIAGNOSIS — R972 Elevated prostate specific antigen [PSA]: Secondary | ICD-10-CM | POA: Diagnosis not present

## 2018-05-04 DIAGNOSIS — M25552 Pain in left hip: Secondary | ICD-10-CM | POA: Diagnosis not present

## 2018-05-26 DIAGNOSIS — M7062 Trochanteric bursitis, left hip: Secondary | ICD-10-CM | POA: Diagnosis not present

## 2018-05-28 DIAGNOSIS — Z85828 Personal history of other malignant neoplasm of skin: Secondary | ICD-10-CM | POA: Diagnosis not present

## 2018-05-28 DIAGNOSIS — D485 Neoplasm of uncertain behavior of skin: Secondary | ICD-10-CM | POA: Diagnosis not present

## 2018-05-28 DIAGNOSIS — L820 Inflamed seborrheic keratosis: Secondary | ICD-10-CM | POA: Diagnosis not present

## 2018-05-28 DIAGNOSIS — D2262 Melanocytic nevi of left upper limb, including shoulder: Secondary | ICD-10-CM | POA: Diagnosis not present

## 2018-05-28 DIAGNOSIS — L821 Other seborrheic keratosis: Secondary | ICD-10-CM | POA: Diagnosis not present

## 2018-05-28 DIAGNOSIS — L570 Actinic keratosis: Secondary | ICD-10-CM | POA: Diagnosis not present

## 2018-05-28 DIAGNOSIS — L720 Epidermal cyst: Secondary | ICD-10-CM | POA: Diagnosis not present

## 2018-05-28 DIAGNOSIS — D225 Melanocytic nevi of trunk: Secondary | ICD-10-CM | POA: Diagnosis not present

## 2018-08-05 DIAGNOSIS — H26493 Other secondary cataract, bilateral: Secondary | ICD-10-CM | POA: Diagnosis not present

## 2018-08-05 DIAGNOSIS — R972 Elevated prostate specific antigen [PSA]: Secondary | ICD-10-CM | POA: Diagnosis not present

## 2018-08-05 DIAGNOSIS — H348312 Tributary (branch) retinal vein occlusion, right eye, stable: Secondary | ICD-10-CM | POA: Diagnosis not present

## 2018-08-05 DIAGNOSIS — H40013 Open angle with borderline findings, low risk, bilateral: Secondary | ICD-10-CM | POA: Diagnosis not present

## 2018-08-17 DIAGNOSIS — R3912 Poor urinary stream: Secondary | ICD-10-CM | POA: Diagnosis not present

## 2018-08-17 DIAGNOSIS — R972 Elevated prostate specific antigen [PSA]: Secondary | ICD-10-CM | POA: Diagnosis not present

## 2018-08-17 DIAGNOSIS — N401 Enlarged prostate with lower urinary tract symptoms: Secondary | ICD-10-CM | POA: Diagnosis not present

## 2018-12-09 DIAGNOSIS — Z0000 Encounter for general adult medical examination without abnormal findings: Secondary | ICD-10-CM | POA: Diagnosis not present

## 2018-12-09 DIAGNOSIS — E559 Vitamin D deficiency, unspecified: Secondary | ICD-10-CM | POA: Diagnosis not present

## 2018-12-09 DIAGNOSIS — E785 Hyperlipidemia, unspecified: Secondary | ICD-10-CM | POA: Diagnosis not present

## 2018-12-09 DIAGNOSIS — Z125 Encounter for screening for malignant neoplasm of prostate: Secondary | ICD-10-CM | POA: Diagnosis not present

## 2018-12-21 DIAGNOSIS — Z0000 Encounter for general adult medical examination without abnormal findings: Secondary | ICD-10-CM | POA: Diagnosis not present

## 2018-12-25 DIAGNOSIS — D225 Melanocytic nevi of trunk: Secondary | ICD-10-CM | POA: Diagnosis not present

## 2018-12-25 DIAGNOSIS — D2262 Melanocytic nevi of left upper limb, including shoulder: Secondary | ICD-10-CM | POA: Diagnosis not present

## 2018-12-25 DIAGNOSIS — L814 Other melanin hyperpigmentation: Secondary | ICD-10-CM | POA: Diagnosis not present

## 2018-12-25 DIAGNOSIS — L570 Actinic keratosis: Secondary | ICD-10-CM | POA: Diagnosis not present

## 2018-12-25 DIAGNOSIS — D1801 Hemangioma of skin and subcutaneous tissue: Secondary | ICD-10-CM | POA: Diagnosis not present

## 2018-12-25 DIAGNOSIS — Z85828 Personal history of other malignant neoplasm of skin: Secondary | ICD-10-CM | POA: Diagnosis not present

## 2018-12-25 DIAGNOSIS — L218 Other seborrheic dermatitis: Secondary | ICD-10-CM | POA: Diagnosis not present

## 2018-12-25 DIAGNOSIS — L821 Other seborrheic keratosis: Secondary | ICD-10-CM | POA: Diagnosis not present

## 2019-01-18 DIAGNOSIS — R3912 Poor urinary stream: Secondary | ICD-10-CM | POA: Diagnosis not present

## 2019-01-18 DIAGNOSIS — R972 Elevated prostate specific antigen [PSA]: Secondary | ICD-10-CM | POA: Diagnosis not present

## 2019-01-18 DIAGNOSIS — N401 Enlarged prostate with lower urinary tract symptoms: Secondary | ICD-10-CM | POA: Diagnosis not present

## 2019-10-06 DIAGNOSIS — D1801 Hemangioma of skin and subcutaneous tissue: Secondary | ICD-10-CM | POA: Diagnosis not present

## 2019-10-06 DIAGNOSIS — L720 Epidermal cyst: Secondary | ICD-10-CM | POA: Diagnosis not present

## 2019-10-06 DIAGNOSIS — D235 Other benign neoplasm of skin of trunk: Secondary | ICD-10-CM | POA: Diagnosis not present

## 2019-10-06 DIAGNOSIS — D485 Neoplasm of uncertain behavior of skin: Secondary | ICD-10-CM | POA: Diagnosis not present

## 2019-10-06 DIAGNOSIS — D225 Melanocytic nevi of trunk: Secondary | ICD-10-CM | POA: Diagnosis not present

## 2019-10-06 DIAGNOSIS — L570 Actinic keratosis: Secondary | ICD-10-CM | POA: Diagnosis not present

## 2019-10-06 DIAGNOSIS — L218 Other seborrheic dermatitis: Secondary | ICD-10-CM | POA: Diagnosis not present

## 2019-10-06 DIAGNOSIS — D2262 Melanocytic nevi of left upper limb, including shoulder: Secondary | ICD-10-CM | POA: Diagnosis not present

## 2019-10-06 DIAGNOSIS — L821 Other seborrheic keratosis: Secondary | ICD-10-CM | POA: Diagnosis not present

## 2019-10-06 DIAGNOSIS — Z85828 Personal history of other malignant neoplasm of skin: Secondary | ICD-10-CM | POA: Diagnosis not present

## 2019-10-19 DIAGNOSIS — H43812 Vitreous degeneration, left eye: Secondary | ICD-10-CM | POA: Diagnosis not present

## 2019-10-19 DIAGNOSIS — H35371 Puckering of macula, right eye: Secondary | ICD-10-CM | POA: Diagnosis not present

## 2019-10-19 DIAGNOSIS — H353132 Nonexudative age-related macular degeneration, bilateral, intermediate dry stage: Secondary | ICD-10-CM | POA: Diagnosis not present

## 2019-10-19 DIAGNOSIS — H348111 Central retinal vein occlusion, right eye, with retinal neovascularization: Secondary | ICD-10-CM | POA: Diagnosis not present

## 2019-10-19 DIAGNOSIS — H211X1 Other vascular disorders of iris and ciliary body, right eye: Secondary | ICD-10-CM | POA: Diagnosis not present

## 2019-10-27 DIAGNOSIS — H348312 Tributary (branch) retinal vein occlusion, right eye, stable: Secondary | ICD-10-CM | POA: Diagnosis not present

## 2019-10-27 DIAGNOSIS — Z961 Presence of intraocular lens: Secondary | ICD-10-CM | POA: Diagnosis not present

## 2019-10-27 DIAGNOSIS — H40013 Open angle with borderline findings, low risk, bilateral: Secondary | ICD-10-CM | POA: Diagnosis not present

## 2019-10-27 DIAGNOSIS — H26492 Other secondary cataract, left eye: Secondary | ICD-10-CM | POA: Diagnosis not present

## 2019-10-27 DIAGNOSIS — H31091 Other chorioretinal scars, right eye: Secondary | ICD-10-CM | POA: Diagnosis not present

## 2019-11-17 ENCOUNTER — Ambulatory Visit: Payer: Medicare Other | Attending: Internal Medicine

## 2019-11-17 DIAGNOSIS — Z20822 Contact with and (suspected) exposure to covid-19: Secondary | ICD-10-CM | POA: Diagnosis not present

## 2019-11-18 LAB — NOVEL CORONAVIRUS, NAA: SARS-CoV-2, NAA: NOT DETECTED

## 2019-11-18 LAB — SARS-COV-2, NAA 2 DAY TAT

## 2019-12-14 DIAGNOSIS — Z1212 Encounter for screening for malignant neoplasm of rectum: Secondary | ICD-10-CM | POA: Diagnosis not present

## 2019-12-14 DIAGNOSIS — E785 Hyperlipidemia, unspecified: Secondary | ICD-10-CM | POA: Diagnosis not present

## 2019-12-14 DIAGNOSIS — Z125 Encounter for screening for malignant neoplasm of prostate: Secondary | ICD-10-CM | POA: Diagnosis not present

## 2019-12-23 DIAGNOSIS — Z0000 Encounter for general adult medical examination without abnormal findings: Secondary | ICD-10-CM | POA: Diagnosis not present

## 2019-12-23 DIAGNOSIS — E559 Vitamin D deficiency, unspecified: Secondary | ICD-10-CM | POA: Diagnosis not present

## 2020-01-27 DIAGNOSIS — Z1211 Encounter for screening for malignant neoplasm of colon: Secondary | ICD-10-CM | POA: Diagnosis not present

## 2020-01-27 DIAGNOSIS — Z1212 Encounter for screening for malignant neoplasm of rectum: Secondary | ICD-10-CM | POA: Diagnosis not present

## 2020-02-07 DIAGNOSIS — R972 Elevated prostate specific antigen [PSA]: Secondary | ICD-10-CM | POA: Diagnosis not present

## 2020-03-22 DIAGNOSIS — R351 Nocturia: Secondary | ICD-10-CM | POA: Diagnosis not present

## 2020-03-22 DIAGNOSIS — R972 Elevated prostate specific antigen [PSA]: Secondary | ICD-10-CM | POA: Diagnosis not present

## 2020-03-22 DIAGNOSIS — N401 Enlarged prostate with lower urinary tract symptoms: Secondary | ICD-10-CM | POA: Diagnosis not present

## 2020-04-13 DIAGNOSIS — Z85828 Personal history of other malignant neoplasm of skin: Secondary | ICD-10-CM | POA: Diagnosis not present

## 2020-04-13 DIAGNOSIS — L814 Other melanin hyperpigmentation: Secondary | ICD-10-CM | POA: Diagnosis not present

## 2020-04-13 DIAGNOSIS — L308 Other specified dermatitis: Secondary | ICD-10-CM | POA: Diagnosis not present

## 2020-04-13 DIAGNOSIS — L821 Other seborrheic keratosis: Secondary | ICD-10-CM | POA: Diagnosis not present

## 2020-04-13 DIAGNOSIS — L720 Epidermal cyst: Secondary | ICD-10-CM | POA: Diagnosis not present

## 2020-04-13 DIAGNOSIS — D2262 Melanocytic nevi of left upper limb, including shoulder: Secondary | ICD-10-CM | POA: Diagnosis not present

## 2020-04-13 DIAGNOSIS — D485 Neoplasm of uncertain behavior of skin: Secondary | ICD-10-CM | POA: Diagnosis not present

## 2020-04-13 DIAGNOSIS — L570 Actinic keratosis: Secondary | ICD-10-CM | POA: Diagnosis not present

## 2020-09-27 DIAGNOSIS — R972 Elevated prostate specific antigen [PSA]: Secondary | ICD-10-CM | POA: Diagnosis not present

## 2020-10-05 ENCOUNTER — Other Ambulatory Visit: Payer: Self-pay | Admitting: Urology

## 2020-10-05 DIAGNOSIS — R972 Elevated prostate specific antigen [PSA]: Secondary | ICD-10-CM

## 2020-10-12 DIAGNOSIS — L570 Actinic keratosis: Secondary | ICD-10-CM | POA: Diagnosis not present

## 2020-10-12 DIAGNOSIS — Z85828 Personal history of other malignant neoplasm of skin: Secondary | ICD-10-CM | POA: Diagnosis not present

## 2020-10-12 DIAGNOSIS — D225 Melanocytic nevi of trunk: Secondary | ICD-10-CM | POA: Diagnosis not present

## 2020-10-12 DIAGNOSIS — L821 Other seborrheic keratosis: Secondary | ICD-10-CM | POA: Diagnosis not present

## 2020-10-12 DIAGNOSIS — D224 Melanocytic nevi of scalp and neck: Secondary | ICD-10-CM | POA: Diagnosis not present

## 2020-10-12 DIAGNOSIS — D2261 Melanocytic nevi of right upper limb, including shoulder: Secondary | ICD-10-CM | POA: Diagnosis not present

## 2020-10-12 DIAGNOSIS — D2262 Melanocytic nevi of left upper limb, including shoulder: Secondary | ICD-10-CM | POA: Diagnosis not present

## 2020-11-07 ENCOUNTER — Other Ambulatory Visit: Payer: Self-pay

## 2020-11-07 ENCOUNTER — Ambulatory Visit
Admission: RE | Admit: 2020-11-07 | Discharge: 2020-11-07 | Disposition: A | Discharge status: Home / Self Care | Payer: Medicare PPO | Source: Ambulatory Visit | Attending: Urology | Admitting: Urology

## 2020-11-07 DIAGNOSIS — R972 Elevated prostate specific antigen [PSA]: Secondary | ICD-10-CM

## 2020-11-07 DIAGNOSIS — N400 Enlarged prostate without lower urinary tract symptoms: Secondary | ICD-10-CM | POA: Diagnosis not present

## 2020-11-07 MED ORDER — GADOBENATE DIMEGLUMINE 529 MG/ML IV SOLN
18.0000 mL | Freq: Once | INTRAVENOUS | Status: AC | PRN
  Administered 2020-11-07: 18 mL via INTRAVENOUS

## 2020-11-21 DIAGNOSIS — H31091 Other chorioretinal scars, right eye: Secondary | ICD-10-CM | POA: Diagnosis not present

## 2020-11-21 DIAGNOSIS — H348312 Tributary (branch) retinal vein occlusion, right eye, stable: Secondary | ICD-10-CM | POA: Diagnosis not present

## 2020-11-21 DIAGNOSIS — H40013 Open angle with borderline findings, low risk, bilateral: Secondary | ICD-10-CM | POA: Diagnosis not present

## 2020-11-21 DIAGNOSIS — H26492 Other secondary cataract, left eye: Secondary | ICD-10-CM | POA: Diagnosis not present

## 2020-11-21 DIAGNOSIS — Z961 Presence of intraocular lens: Secondary | ICD-10-CM | POA: Diagnosis not present

## 2020-11-28 DIAGNOSIS — M25561 Pain in right knee: Secondary | ICD-10-CM | POA: Diagnosis not present

## 2020-11-28 DIAGNOSIS — M1711 Unilateral primary osteoarthritis, right knee: Secondary | ICD-10-CM | POA: Diagnosis not present

## 2020-12-08 DIAGNOSIS — R9349 Abnormal radiologic findings on diagnostic imaging of other urinary organs: Secondary | ICD-10-CM | POA: Diagnosis not present

## 2020-12-08 DIAGNOSIS — R972 Elevated prostate specific antigen [PSA]: Secondary | ICD-10-CM | POA: Diagnosis not present

## 2021-01-02 DIAGNOSIS — E559 Vitamin D deficiency, unspecified: Secondary | ICD-10-CM | POA: Diagnosis not present

## 2021-01-02 DIAGNOSIS — R972 Elevated prostate specific antigen [PSA]: Secondary | ICD-10-CM | POA: Diagnosis not present

## 2021-01-02 DIAGNOSIS — E785 Hyperlipidemia, unspecified: Secondary | ICD-10-CM | POA: Diagnosis not present

## 2021-01-02 DIAGNOSIS — R7309 Other abnormal glucose: Secondary | ICD-10-CM | POA: Diagnosis not present

## 2021-01-02 DIAGNOSIS — Z0000 Encounter for general adult medical examination without abnormal findings: Secondary | ICD-10-CM | POA: Diagnosis not present

## 2021-01-03 DIAGNOSIS — R972 Elevated prostate specific antigen [PSA]: Secondary | ICD-10-CM | POA: Diagnosis not present

## 2021-01-05 DIAGNOSIS — R972 Elevated prostate specific antigen [PSA]: Secondary | ICD-10-CM | POA: Diagnosis not present

## 2021-01-05 DIAGNOSIS — Z0000 Encounter for general adult medical examination without abnormal findings: Secondary | ICD-10-CM | POA: Diagnosis not present

## 2021-01-05 DIAGNOSIS — E559 Vitamin D deficiency, unspecified: Secondary | ICD-10-CM | POA: Diagnosis not present

## 2021-03-28 DIAGNOSIS — L570 Actinic keratosis: Secondary | ICD-10-CM | POA: Diagnosis not present

## 2021-03-28 DIAGNOSIS — D2262 Melanocytic nevi of left upper limb, including shoulder: Secondary | ICD-10-CM | POA: Diagnosis not present

## 2021-03-28 DIAGNOSIS — L821 Other seborrheic keratosis: Secondary | ICD-10-CM | POA: Diagnosis not present

## 2021-03-28 DIAGNOSIS — L814 Other melanin hyperpigmentation: Secondary | ICD-10-CM | POA: Diagnosis not present

## 2021-03-28 DIAGNOSIS — Z85828 Personal history of other malignant neoplasm of skin: Secondary | ICD-10-CM | POA: Diagnosis not present

## 2021-03-28 DIAGNOSIS — D225 Melanocytic nevi of trunk: Secondary | ICD-10-CM | POA: Diagnosis not present

## 2021-03-28 DIAGNOSIS — L918 Other hypertrophic disorders of the skin: Secondary | ICD-10-CM | POA: Diagnosis not present

## 2021-03-28 DIAGNOSIS — D1801 Hemangioma of skin and subcutaneous tissue: Secondary | ICD-10-CM | POA: Diagnosis not present

## 2021-03-28 DIAGNOSIS — L720 Epidermal cyst: Secondary | ICD-10-CM | POA: Diagnosis not present

## 2021-05-21 ENCOUNTER — Other Ambulatory Visit: Payer: Self-pay | Admitting: Internal Medicine

## 2021-05-21 ENCOUNTER — Other Ambulatory Visit: Payer: Self-pay | Admitting: Registered Nurse

## 2021-05-21 DIAGNOSIS — E7800 Pure hypercholesterolemia, unspecified: Secondary | ICD-10-CM

## 2021-05-31 DIAGNOSIS — M170 Bilateral primary osteoarthritis of knee: Secondary | ICD-10-CM | POA: Diagnosis not present

## 2021-05-31 DIAGNOSIS — M25562 Pain in left knee: Secondary | ICD-10-CM | POA: Diagnosis not present

## 2021-06-21 DIAGNOSIS — M6281 Muscle weakness (generalized): Secondary | ICD-10-CM | POA: Diagnosis not present

## 2021-06-21 DIAGNOSIS — M170 Bilateral primary osteoarthritis of knee: Secondary | ICD-10-CM | POA: Diagnosis not present

## 2021-06-26 DIAGNOSIS — M170 Bilateral primary osteoarthritis of knee: Secondary | ICD-10-CM | POA: Diagnosis not present

## 2021-06-26 DIAGNOSIS — M6281 Muscle weakness (generalized): Secondary | ICD-10-CM | POA: Diagnosis not present

## 2021-06-28 ENCOUNTER — Ambulatory Visit
Admission: RE | Admit: 2021-06-28 | Discharge: 2021-06-28 | Disposition: A | Discharge status: Home / Self Care | Payer: Medicare PPO | Source: Ambulatory Visit | Attending: Registered Nurse | Admitting: Registered Nurse

## 2021-06-28 DIAGNOSIS — E7800 Pure hypercholesterolemia, unspecified: Secondary | ICD-10-CM

## 2021-06-29 DIAGNOSIS — M170 Bilateral primary osteoarthritis of knee: Secondary | ICD-10-CM | POA: Diagnosis not present

## 2021-06-29 DIAGNOSIS — M6281 Muscle weakness (generalized): Secondary | ICD-10-CM | POA: Diagnosis not present

## 2021-07-02 DIAGNOSIS — M170 Bilateral primary osteoarthritis of knee: Secondary | ICD-10-CM | POA: Diagnosis not present

## 2021-07-02 DIAGNOSIS — M6281 Muscle weakness (generalized): Secondary | ICD-10-CM | POA: Diagnosis not present

## 2021-07-05 DIAGNOSIS — M6281 Muscle weakness (generalized): Secondary | ICD-10-CM | POA: Diagnosis not present

## 2021-07-05 DIAGNOSIS — M170 Bilateral primary osteoarthritis of knee: Secondary | ICD-10-CM | POA: Diagnosis not present

## 2021-07-31 DIAGNOSIS — M25562 Pain in left knee: Secondary | ICD-10-CM | POA: Diagnosis not present

## 2021-07-31 DIAGNOSIS — M25561 Pain in right knee: Secondary | ICD-10-CM | POA: Diagnosis not present

## 2021-08-03 DIAGNOSIS — M170 Bilateral primary osteoarthritis of knee: Secondary | ICD-10-CM | POA: Diagnosis not present

## 2021-08-03 DIAGNOSIS — M6281 Muscle weakness (generalized): Secondary | ICD-10-CM | POA: Diagnosis not present

## 2021-08-07 DIAGNOSIS — M6281 Muscle weakness (generalized): Secondary | ICD-10-CM | POA: Diagnosis not present

## 2021-08-07 DIAGNOSIS — M170 Bilateral primary osteoarthritis of knee: Secondary | ICD-10-CM | POA: Diagnosis not present

## 2021-08-10 DIAGNOSIS — M6281 Muscle weakness (generalized): Secondary | ICD-10-CM | POA: Diagnosis not present

## 2021-08-10 DIAGNOSIS — M170 Bilateral primary osteoarthritis of knee: Secondary | ICD-10-CM | POA: Diagnosis not present

## 2021-08-14 DIAGNOSIS — M170 Bilateral primary osteoarthritis of knee: Secondary | ICD-10-CM | POA: Diagnosis not present

## 2021-08-14 DIAGNOSIS — M6281 Muscle weakness (generalized): Secondary | ICD-10-CM | POA: Diagnosis not present

## 2021-08-17 DIAGNOSIS — M170 Bilateral primary osteoarthritis of knee: Secondary | ICD-10-CM | POA: Diagnosis not present

## 2021-08-17 DIAGNOSIS — M6281 Muscle weakness (generalized): Secondary | ICD-10-CM | POA: Diagnosis not present

## 2021-08-27 DIAGNOSIS — M25562 Pain in left knee: Secondary | ICD-10-CM | POA: Diagnosis not present

## 2021-08-28 DIAGNOSIS — M170 Bilateral primary osteoarthritis of knee: Secondary | ICD-10-CM | POA: Diagnosis not present

## 2021-08-28 DIAGNOSIS — M6281 Muscle weakness (generalized): Secondary | ICD-10-CM | POA: Diagnosis not present

## 2021-08-31 DIAGNOSIS — M6281 Muscle weakness (generalized): Secondary | ICD-10-CM | POA: Diagnosis not present

## 2021-08-31 DIAGNOSIS — M170 Bilateral primary osteoarthritis of knee: Secondary | ICD-10-CM | POA: Diagnosis not present

## 2021-08-31 DIAGNOSIS — M1712 Unilateral primary osteoarthritis, left knee: Secondary | ICD-10-CM | POA: Diagnosis not present

## 2021-08-31 DIAGNOSIS — M1711 Unilateral primary osteoarthritis, right knee: Secondary | ICD-10-CM | POA: Diagnosis not present

## 2021-09-04 DIAGNOSIS — M170 Bilateral primary osteoarthritis of knee: Secondary | ICD-10-CM | POA: Diagnosis not present

## 2021-09-04 DIAGNOSIS — M6281 Muscle weakness (generalized): Secondary | ICD-10-CM | POA: Diagnosis not present

## 2021-09-10 DIAGNOSIS — R972 Elevated prostate specific antigen [PSA]: Secondary | ICD-10-CM | POA: Diagnosis not present

## 2021-09-17 DIAGNOSIS — R351 Nocturia: Secondary | ICD-10-CM | POA: Diagnosis not present

## 2021-09-17 DIAGNOSIS — N401 Enlarged prostate with lower urinary tract symptoms: Secondary | ICD-10-CM | POA: Diagnosis not present

## 2021-09-17 DIAGNOSIS — N4232 Atypical small acinar proliferation of prostate: Secondary | ICD-10-CM | POA: Diagnosis not present

## 2021-09-17 DIAGNOSIS — R972 Elevated prostate specific antigen [PSA]: Secondary | ICD-10-CM | POA: Diagnosis not present

## 2021-09-17 DIAGNOSIS — R3912 Poor urinary stream: Secondary | ICD-10-CM | POA: Diagnosis not present

## 2021-09-24 DIAGNOSIS — M6281 Muscle weakness (generalized): Secondary | ICD-10-CM | POA: Diagnosis not present

## 2021-09-24 DIAGNOSIS — M170 Bilateral primary osteoarthritis of knee: Secondary | ICD-10-CM | POA: Diagnosis not present

## 2021-09-25 DIAGNOSIS — D485 Neoplasm of uncertain behavior of skin: Secondary | ICD-10-CM | POA: Diagnosis not present

## 2021-09-25 DIAGNOSIS — C44319 Basal cell carcinoma of skin of other parts of face: Secondary | ICD-10-CM | POA: Diagnosis not present

## 2021-09-25 DIAGNOSIS — L821 Other seborrheic keratosis: Secondary | ICD-10-CM | POA: Diagnosis not present

## 2021-09-25 DIAGNOSIS — L814 Other melanin hyperpigmentation: Secondary | ICD-10-CM | POA: Diagnosis not present

## 2021-09-25 DIAGNOSIS — L570 Actinic keratosis: Secondary | ICD-10-CM | POA: Diagnosis not present

## 2021-09-25 DIAGNOSIS — D1801 Hemangioma of skin and subcutaneous tissue: Secondary | ICD-10-CM | POA: Diagnosis not present

## 2021-09-25 DIAGNOSIS — Z85828 Personal history of other malignant neoplasm of skin: Secondary | ICD-10-CM | POA: Diagnosis not present

## 2021-09-25 DIAGNOSIS — D2261 Melanocytic nevi of right upper limb, including shoulder: Secondary | ICD-10-CM | POA: Diagnosis not present

## 2021-10-09 DIAGNOSIS — M170 Bilateral primary osteoarthritis of knee: Secondary | ICD-10-CM | POA: Diagnosis not present

## 2021-10-12 DIAGNOSIS — M170 Bilateral primary osteoarthritis of knee: Secondary | ICD-10-CM | POA: Diagnosis not present

## 2021-10-12 DIAGNOSIS — M6281 Muscle weakness (generalized): Secondary | ICD-10-CM | POA: Diagnosis not present

## 2021-10-15 ENCOUNTER — Ambulatory Visit (INDEPENDENT_AMBULATORY_CARE_PROVIDER_SITE_OTHER): Payer: Medicare PPO | Admitting: Ophthalmology

## 2021-10-15 ENCOUNTER — Encounter (INDEPENDENT_AMBULATORY_CARE_PROVIDER_SITE_OTHER): Payer: Self-pay | Admitting: Ophthalmology

## 2021-10-15 ENCOUNTER — Other Ambulatory Visit: Payer: Self-pay

## 2021-10-15 DIAGNOSIS — H348112 Central retinal vein occlusion, right eye, stable: Secondary | ICD-10-CM | POA: Diagnosis not present

## 2021-10-15 DIAGNOSIS — M170 Bilateral primary osteoarthritis of knee: Secondary | ICD-10-CM | POA: Diagnosis not present

## 2021-10-15 DIAGNOSIS — M6281 Muscle weakness (generalized): Secondary | ICD-10-CM | POA: Diagnosis not present
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
## 2021-10-15 NOTE — Assessment & Plan Note (Signed)
See medical record, med flow, Dr. Zadie Rhine, [facility] records ?

## 2021-10-18 DIAGNOSIS — M6281 Muscle weakness (generalized): Secondary | ICD-10-CM | POA: Diagnosis not present

## 2021-10-18 DIAGNOSIS — M170 Bilateral primary osteoarthritis of knee: Secondary | ICD-10-CM | POA: Diagnosis not present

## 2021-10-23 DIAGNOSIS — S83242A Other tear of medial meniscus, current injury, left knee, initial encounter: Secondary | ICD-10-CM | POA: Diagnosis not present

## 2021-10-23 DIAGNOSIS — M25562 Pain in left knee: Secondary | ICD-10-CM | POA: Diagnosis not present

## 2021-10-29 DIAGNOSIS — M25562 Pain in left knee: Secondary | ICD-10-CM | POA: Diagnosis not present

## 2021-11-14 DIAGNOSIS — Z85828 Personal history of other malignant neoplasm of skin: Secondary | ICD-10-CM | POA: Diagnosis not present

## 2021-11-14 DIAGNOSIS — C44319 Basal cell carcinoma of skin of other parts of face: Secondary | ICD-10-CM | POA: Diagnosis not present

## 2021-11-15 DIAGNOSIS — M25562 Pain in left knee: Secondary | ICD-10-CM | POA: Diagnosis not present

## 2021-11-15 DIAGNOSIS — S83242A Other tear of medial meniscus, current injury, left knee, initial encounter: Secondary | ICD-10-CM | POA: Diagnosis not present

## 2021-11-21 DIAGNOSIS — H26492 Other secondary cataract, left eye: Secondary | ICD-10-CM | POA: Diagnosis not present

## 2021-11-21 DIAGNOSIS — H40013 Open angle with borderline findings, low risk, bilateral: Secondary | ICD-10-CM | POA: Diagnosis not present

## 2021-11-21 DIAGNOSIS — H348312 Tributary (branch) retinal vein occlusion, right eye, stable: Secondary | ICD-10-CM | POA: Diagnosis not present

## 2021-11-21 DIAGNOSIS — H31091 Other chorioretinal scars, right eye: Secondary | ICD-10-CM | POA: Diagnosis not present

## 2021-11-21 DIAGNOSIS — Z961 Presence of intraocular lens: Secondary | ICD-10-CM | POA: Diagnosis not present

## 2022-01-08 DIAGNOSIS — R972 Elevated prostate specific antigen [PSA]: Secondary | ICD-10-CM | POA: Diagnosis not present

## 2022-01-08 DIAGNOSIS — Z125 Encounter for screening for malignant neoplasm of prostate: Secondary | ICD-10-CM | POA: Diagnosis not present

## 2022-01-08 DIAGNOSIS — Z0000 Encounter for general adult medical examination without abnormal findings: Secondary | ICD-10-CM | POA: Diagnosis not present

## 2022-01-08 DIAGNOSIS — E559 Vitamin D deficiency, unspecified: Secondary | ICD-10-CM | POA: Diagnosis not present

## 2022-01-08 DIAGNOSIS — E785 Hyperlipidemia, unspecified: Secondary | ICD-10-CM | POA: Diagnosis not present

## 2022-01-11 DIAGNOSIS — R972 Elevated prostate specific antigen [PSA]: Secondary | ICD-10-CM | POA: Diagnosis not present

## 2022-01-11 DIAGNOSIS — E7800 Pure hypercholesterolemia, unspecified: Secondary | ICD-10-CM | POA: Diagnosis not present

## 2022-01-11 DIAGNOSIS — Z0000 Encounter for general adult medical examination without abnormal findings: Secondary | ICD-10-CM | POA: Diagnosis not present

## 2022-01-11 DIAGNOSIS — E559 Vitamin D deficiency, unspecified: Secondary | ICD-10-CM | POA: Diagnosis not present

## 2022-03-11 DIAGNOSIS — R972 Elevated prostate specific antigen [PSA]: Secondary | ICD-10-CM | POA: Diagnosis not present

## 2022-03-18 DIAGNOSIS — R972 Elevated prostate specific antigen [PSA]: Secondary | ICD-10-CM | POA: Diagnosis not present

## 2022-03-18 DIAGNOSIS — R3912 Poor urinary stream: Secondary | ICD-10-CM | POA: Diagnosis not present

## 2022-03-18 DIAGNOSIS — N401 Enlarged prostate with lower urinary tract symptoms: Secondary | ICD-10-CM | POA: Diagnosis not present

## 2022-03-18 DIAGNOSIS — R351 Nocturia: Secondary | ICD-10-CM | POA: Diagnosis not present

## 2022-04-01 DIAGNOSIS — L218 Other seborrheic dermatitis: Secondary | ICD-10-CM | POA: Diagnosis not present

## 2022-04-01 DIAGNOSIS — D2262 Melanocytic nevi of left upper limb, including shoulder: Secondary | ICD-10-CM | POA: Diagnosis not present

## 2022-04-01 DIAGNOSIS — D225 Melanocytic nevi of trunk: Secondary | ICD-10-CM | POA: Diagnosis not present

## 2022-04-01 DIAGNOSIS — L570 Actinic keratosis: Secondary | ICD-10-CM | POA: Diagnosis not present

## 2022-04-01 DIAGNOSIS — Z85828 Personal history of other malignant neoplasm of skin: Secondary | ICD-10-CM | POA: Diagnosis not present

## 2022-04-01 DIAGNOSIS — L720 Epidermal cyst: Secondary | ICD-10-CM | POA: Diagnosis not present

## 2022-04-01 DIAGNOSIS — L821 Other seborrheic keratosis: Secondary | ICD-10-CM | POA: Diagnosis not present

## 2022-09-30 DIAGNOSIS — D2262 Melanocytic nevi of left upper limb, including shoulder: Secondary | ICD-10-CM | POA: Diagnosis not present

## 2022-09-30 DIAGNOSIS — L821 Other seborrheic keratosis: Secondary | ICD-10-CM | POA: Diagnosis not present

## 2022-09-30 DIAGNOSIS — Z85828 Personal history of other malignant neoplasm of skin: Secondary | ICD-10-CM | POA: Diagnosis not present

## 2022-09-30 DIAGNOSIS — D225 Melanocytic nevi of trunk: Secondary | ICD-10-CM | POA: Diagnosis not present

## 2022-09-30 DIAGNOSIS — L720 Epidermal cyst: Secondary | ICD-10-CM | POA: Diagnosis not present

## 2022-09-30 DIAGNOSIS — L700 Acne vulgaris: Secondary | ICD-10-CM | POA: Diagnosis not present

## 2022-10-17 DIAGNOSIS — R972 Elevated prostate specific antigen [PSA]: Secondary | ICD-10-CM | POA: Diagnosis not present

## 2022-10-25 DIAGNOSIS — R3912 Poor urinary stream: Secondary | ICD-10-CM | POA: Diagnosis not present

## 2022-10-25 DIAGNOSIS — N3943 Post-void dribbling: Secondary | ICD-10-CM | POA: Diagnosis not present

## 2022-10-25 DIAGNOSIS — R972 Elevated prostate specific antigen [PSA]: Secondary | ICD-10-CM | POA: Diagnosis not present

## 2022-10-25 DIAGNOSIS — N401 Enlarged prostate with lower urinary tract symptoms: Secondary | ICD-10-CM | POA: Diagnosis not present

## 2022-10-25 DIAGNOSIS — N4232 Atypical small acinar proliferation of prostate: Secondary | ICD-10-CM | POA: Diagnosis not present

## 2022-11-14 ENCOUNTER — Encounter (INDEPENDENT_AMBULATORY_CARE_PROVIDER_SITE_OTHER): Payer: Medicare PPO | Admitting: Ophthalmology

## 2022-11-14 DIAGNOSIS — H348112 Central retinal vein occlusion, right eye, stable: Secondary | ICD-10-CM | POA: Diagnosis not present

## 2022-11-14 DIAGNOSIS — H43813 Vitreous degeneration, bilateral: Secondary | ICD-10-CM | POA: Diagnosis not present

## 2022-11-27 DIAGNOSIS — H40013 Open angle with borderline findings, low risk, bilateral: Secondary | ICD-10-CM | POA: Diagnosis not present

## 2022-11-27 DIAGNOSIS — H348312 Tributary (branch) retinal vein occlusion, right eye, stable: Secondary | ICD-10-CM | POA: Diagnosis not present

## 2022-11-27 DIAGNOSIS — H31091 Other chorioretinal scars, right eye: Secondary | ICD-10-CM | POA: Diagnosis not present

## 2022-11-27 DIAGNOSIS — H26492 Other secondary cataract, left eye: Secondary | ICD-10-CM | POA: Diagnosis not present

## 2023-01-07 DIAGNOSIS — E559 Vitamin D deficiency, unspecified: Secondary | ICD-10-CM | POA: Diagnosis not present

## 2023-01-07 DIAGNOSIS — R7309 Other abnormal glucose: Secondary | ICD-10-CM | POA: Diagnosis not present

## 2023-01-07 DIAGNOSIS — Z125 Encounter for screening for malignant neoplasm of prostate: Secondary | ICD-10-CM | POA: Diagnosis not present

## 2023-01-07 DIAGNOSIS — E785 Hyperlipidemia, unspecified: Secondary | ICD-10-CM | POA: Diagnosis not present

## 2023-01-15 DIAGNOSIS — E7800 Pure hypercholesterolemia, unspecified: Secondary | ICD-10-CM | POA: Diagnosis not present

## 2023-01-15 DIAGNOSIS — C4491 Basal cell carcinoma of skin, unspecified: Secondary | ICD-10-CM | POA: Diagnosis not present

## 2023-01-15 DIAGNOSIS — R972 Elevated prostate specific antigen [PSA]: Secondary | ICD-10-CM | POA: Diagnosis not present

## 2023-01-15 DIAGNOSIS — Z0000 Encounter for general adult medical examination without abnormal findings: Secondary | ICD-10-CM | POA: Diagnosis not present

## 2023-01-15 DIAGNOSIS — E559 Vitamin D deficiency, unspecified: Secondary | ICD-10-CM | POA: Diagnosis not present

## 2023-01-16 DIAGNOSIS — Z85828 Personal history of other malignant neoplasm of skin: Secondary | ICD-10-CM | POA: Diagnosis not present

## 2023-01-16 DIAGNOSIS — C44622 Squamous cell carcinoma of skin of right upper limb, including shoulder: Secondary | ICD-10-CM | POA: Diagnosis not present

## 2023-02-10 DIAGNOSIS — Z1211 Encounter for screening for malignant neoplasm of colon: Secondary | ICD-10-CM | POA: Diagnosis not present

## 2023-02-10 DIAGNOSIS — Z1212 Encounter for screening for malignant neoplasm of rectum: Secondary | ICD-10-CM | POA: Diagnosis not present

## 2023-04-03 ENCOUNTER — Inpatient Hospital Stay (HOSPITAL_COMMUNITY)
Admission: EM | Admit: 2023-04-03 | Discharge: 2023-04-07 | DRG: 481 | Disposition: A | Discharge status: Skilled Nursing Facility | Payer: Medicare PPO | Attending: Internal Medicine | Admitting: Internal Medicine

## 2023-04-03 ENCOUNTER — Inpatient Hospital Stay (HOSPITAL_COMMUNITY): Payer: Medicare PPO

## 2023-04-03 ENCOUNTER — Other Ambulatory Visit: Payer: Self-pay

## 2023-04-03 ENCOUNTER — Emergency Department (HOSPITAL_COMMUNITY): Payer: Medicare PPO

## 2023-04-03 ENCOUNTER — Encounter (HOSPITAL_COMMUNITY)
Admission: EM | Disposition: A | Discharge status: Skilled Nursing Facility | Payer: Self-pay | Source: Home / Self Care | Attending: Internal Medicine

## 2023-04-03 ENCOUNTER — Encounter (HOSPITAL_COMMUNITY): Payer: Self-pay

## 2023-04-03 DIAGNOSIS — Z974 Presence of external hearing-aid: Secondary | ICD-10-CM | POA: Diagnosis not present

## 2023-04-03 DIAGNOSIS — Z87891 Personal history of nicotine dependence: Secondary | ICD-10-CM | POA: Diagnosis not present

## 2023-04-03 DIAGNOSIS — D6489 Other specified anemias: Secondary | ICD-10-CM | POA: Diagnosis not present

## 2023-04-03 DIAGNOSIS — E871 Hypo-osmolality and hyponatremia: Secondary | ICD-10-CM | POA: Diagnosis present

## 2023-04-03 DIAGNOSIS — L720 Epidermal cyst: Secondary | ICD-10-CM | POA: Diagnosis not present

## 2023-04-03 DIAGNOSIS — W010XXA Fall on same level from slipping, tripping and stumbling without subsequent striking against object, initial encounter: Secondary | ICD-10-CM | POA: Diagnosis present

## 2023-04-03 DIAGNOSIS — Z7982 Long term (current) use of aspirin: Secondary | ICD-10-CM

## 2023-04-03 DIAGNOSIS — S79929A Unspecified injury of unspecified thigh, initial encounter: Secondary | ICD-10-CM | POA: Diagnosis not present

## 2023-04-03 DIAGNOSIS — D2271 Melanocytic nevi of right lower limb, including hip: Secondary | ICD-10-CM | POA: Diagnosis not present

## 2023-04-03 DIAGNOSIS — S7221XA Displaced subtrochanteric fracture of right femur, initial encounter for closed fracture: Secondary | ICD-10-CM | POA: Diagnosis not present

## 2023-04-03 DIAGNOSIS — M6281 Muscle weakness (generalized): Secondary | ICD-10-CM | POA: Diagnosis not present

## 2023-04-03 DIAGNOSIS — S72141A Displaced intertrochanteric fracture of right femur, initial encounter for closed fracture: Secondary | ICD-10-CM | POA: Diagnosis not present

## 2023-04-03 DIAGNOSIS — Z8249 Family history of ischemic heart disease and other diseases of the circulatory system: Secondary | ICD-10-CM | POA: Diagnosis not present

## 2023-04-03 DIAGNOSIS — I2510 Atherosclerotic heart disease of native coronary artery without angina pectoris: Secondary | ICD-10-CM | POA: Diagnosis present

## 2023-04-03 DIAGNOSIS — R2681 Unsteadiness on feet: Secondary | ICD-10-CM | POA: Diagnosis not present

## 2023-04-03 DIAGNOSIS — M25551 Pain in right hip: Secondary | ICD-10-CM | POA: Diagnosis not present

## 2023-04-03 DIAGNOSIS — H9193 Unspecified hearing loss, bilateral: Secondary | ICD-10-CM | POA: Diagnosis not present

## 2023-04-03 DIAGNOSIS — S7221XD Displaced subtrochanteric fracture of right femur, subsequent encounter for closed fracture with routine healing: Secondary | ICD-10-CM | POA: Diagnosis not present

## 2023-04-03 DIAGNOSIS — Z79899 Other long term (current) drug therapy: Secondary | ICD-10-CM

## 2023-04-03 DIAGNOSIS — R2689 Other abnormalities of gait and mobility: Secondary | ICD-10-CM | POA: Diagnosis not present

## 2023-04-03 DIAGNOSIS — S7224XA Nondisplaced subtrochanteric fracture of right femur, initial encounter for closed fracture: Secondary | ICD-10-CM

## 2023-04-03 DIAGNOSIS — R9431 Abnormal electrocardiogram [ECG] [EKG]: Secondary | ICD-10-CM | POA: Diagnosis not present

## 2023-04-03 DIAGNOSIS — Z743 Need for continuous supervision: Secondary | ICD-10-CM | POA: Diagnosis not present

## 2023-04-03 DIAGNOSIS — S72001A Fracture of unspecified part of neck of right femur, initial encounter for closed fracture: Secondary | ICD-10-CM | POA: Diagnosis not present

## 2023-04-03 DIAGNOSIS — S72144A Nondisplaced intertrochanteric fracture of right femur, initial encounter for closed fracture: Secondary | ICD-10-CM | POA: Diagnosis not present

## 2023-04-03 DIAGNOSIS — Y92007 Garden or yard of unspecified non-institutional (private) residence as the place of occurrence of the external cause: Secondary | ICD-10-CM | POA: Diagnosis not present

## 2023-04-03 DIAGNOSIS — S72009A Fracture of unspecified part of neck of unspecified femur, initial encounter for closed fracture: Secondary | ICD-10-CM | POA: Diagnosis not present

## 2023-04-03 DIAGNOSIS — R1312 Dysphagia, oropharyngeal phase: Secondary | ICD-10-CM | POA: Diagnosis not present

## 2023-04-03 DIAGNOSIS — Z85828 Personal history of other malignant neoplasm of skin: Secondary | ICD-10-CM | POA: Diagnosis not present

## 2023-04-03 DIAGNOSIS — D649 Anemia, unspecified: Secondary | ICD-10-CM | POA: Diagnosis not present

## 2023-04-03 DIAGNOSIS — L218 Other seborrheic dermatitis: Secondary | ICD-10-CM | POA: Diagnosis not present

## 2023-04-03 DIAGNOSIS — D225 Melanocytic nevi of trunk: Secondary | ICD-10-CM | POA: Diagnosis not present

## 2023-04-03 DIAGNOSIS — D2262 Melanocytic nevi of left upper limb, including shoulder: Secondary | ICD-10-CM | POA: Diagnosis not present

## 2023-04-03 DIAGNOSIS — Y9301 Activity, walking, marching and hiking: Secondary | ICD-10-CM | POA: Diagnosis present

## 2023-04-03 DIAGNOSIS — S7291XA Unspecified fracture of right femur, initial encounter for closed fracture: Secondary | ICD-10-CM

## 2023-04-03 DIAGNOSIS — L821 Other seborrheic keratosis: Secondary | ICD-10-CM | POA: Diagnosis not present

## 2023-04-03 DIAGNOSIS — Z9181 History of falling: Secondary | ICD-10-CM | POA: Diagnosis not present

## 2023-04-03 HISTORY — PX: INTRAMEDULLARY (IM) NAIL INTERTROCHANTERIC: SHX5875

## 2023-04-03 LAB — CBC WITH DIFFERENTIAL/PLATELET
Abs Immature Granulocytes: 0.04 10*3/uL (ref 0.00–0.07)
Basophils Absolute: 0.1 10*3/uL (ref 0.0–0.1)
Basophils Relative: 1 %
Eosinophils Absolute: 0 10*3/uL (ref 0.0–0.5)
Eosinophils Relative: 0 %
HCT: 44.5 % (ref 39.0–52.0)
Hemoglobin: 15.4 g/dL (ref 13.0–17.0)
Immature Granulocytes: 0 %
Lymphocytes Relative: 9 %
Lymphs Abs: 1 10*3/uL (ref 0.7–4.0)
MCH: 32.6 pg (ref 26.0–34.0)
MCHC: 34.6 g/dL (ref 30.0–36.0)
MCV: 94.3 fL (ref 80.0–100.0)
Monocytes Absolute: 0.5 10*3/uL (ref 0.1–1.0)
Monocytes Relative: 4 %
Neutro Abs: 9.1 10*3/uL — ABNORMAL HIGH (ref 1.7–7.7)
Neutrophils Relative %: 86 %
Platelets: 209 10*3/uL (ref 150–400)
RBC: 4.72 MIL/uL (ref 4.22–5.81)
RDW: 13.3 % (ref 11.5–15.5)
WBC: 10.7 10*3/uL — ABNORMAL HIGH (ref 4.0–10.5)
nRBC: 0 % (ref 0.0–0.2)

## 2023-04-03 LAB — BASIC METABOLIC PANEL
Anion gap: 11 (ref 5–15)
BUN: 16 mg/dL (ref 8–23)
CO2: 21 mmol/L — ABNORMAL LOW (ref 22–32)
Calcium: 9.3 mg/dL (ref 8.9–10.3)
Chloride: 104 mmol/L (ref 98–111)
Creatinine, Ser: 0.89 mg/dL (ref 0.61–1.24)
GFR, Estimated: >60 mL/min (ref >60)
Glucose, Bld: 135 mg/dL — ABNORMAL HIGH (ref 70–99)
Potassium: 3.9 mmol/L (ref 3.5–5.1)
Sodium: 136 mmol/L (ref 135–145)

## 2023-04-03 LAB — TYPE AND SCREEN
ABO/RH(D): O POS
Antibody Screen: NEGATIVE

## 2023-04-03 LAB — SURGICAL PCR SCREEN

## 2023-04-03 LAB — ABO/RH: ABO/RH(D): O POS

## 2023-04-03 SURGERY — FIXATION, FRACTURE, INTERTROCHANTERIC, WITH INTRAMEDULLARY ROD
Anesthesia: General | Site: Hip | Laterality: Right

## 2023-04-03 MED ORDER — PROPOFOL 10 MG/ML IV BOLUS
INTRAVENOUS | Status: DC | PRN
Start: 2023-04-03 — End: 2023-04-03
  Administered 2023-04-03: 50 mg via INTRAVENOUS
  Administered 2023-04-03: 100 mg via INTRAVENOUS

## 2023-04-03 MED ORDER — CEFAZOLIN SODIUM-DEXTROSE 2-4 GM/100ML-% IV SOLN
INTRAVENOUS | Status: AC
  Filled 2023-04-03: qty 100

## 2023-04-03 MED ORDER — ORAL CARE MOUTH RINSE: 15.0000 mL | Freq: Once | OROMUCOSAL | Status: AC

## 2023-04-03 MED ORDER — SUGAMMADEX SODIUM 200 MG/2ML IV SOLN
INTRAVENOUS | Status: DC | PRN
  Administered 2023-04-03: 181.4 mg via INTRAVENOUS

## 2023-04-03 MED ORDER — EPHEDRINE SULFATE-NACL 50-0.9 MG/10ML-% IV SOSY
PREFILLED_SYRINGE | INTRAVENOUS | Status: DC | PRN
  Administered 2023-04-03: 10 mg via INTRAVENOUS

## 2023-04-03 MED ORDER — ONDANSETRON HCL 4 MG/2ML IJ SOLN
INTRAMUSCULAR | Status: DC | PRN
  Administered 2023-04-03: 4 mg via INTRAVENOUS

## 2023-04-03 MED ORDER — PROPOFOL 10 MG/ML IV BOLUS
INTRAVENOUS | Status: AC
  Filled 2023-04-03: qty 20

## 2023-04-03 MED ORDER — FENTANYL CITRATE (PF) 250 MCG/5ML IJ SOLN
INTRAMUSCULAR | Status: AC
  Filled 2023-04-03: qty 5

## 2023-04-03 MED ORDER — CEFAZOLIN SODIUM-DEXTROSE 2-4 GM/100ML-% IV SOLN
2.0000 g | INTRAVENOUS | Status: AC
  Administered 2023-04-03: 2 g via INTRAVENOUS
  Filled 2023-04-03: qty 100

## 2023-04-03 MED ORDER — LACTATED RINGERS IV SOLN: INTRAVENOUS | Status: DC

## 2023-04-03 MED ORDER — ASPIRIN 81 MG PO TBEC
81.0000 mg | DELAYED_RELEASE_TABLET | Freq: Every day | ORAL | Status: DC
  Administered 2023-04-04: 81 mg via ORAL
  Filled 2023-04-03: qty 1

## 2023-04-03 MED ORDER — ONDANSETRON HCL 4 MG/2ML IJ SOLN
4.0000 mg | Freq: Once | INTRAMUSCULAR | Status: AC
  Administered 2023-04-03: 4 mg via INTRAVENOUS
  Filled 2023-04-03: qty 2

## 2023-04-03 MED ORDER — TRANEXAMIC ACID-NACL 1000-0.7 MG/100ML-% IV SOLN
INTRAVENOUS | Status: DC | PRN
Start: 2023-04-03 — End: 2023-04-03
  Administered 2023-04-03: 1000 mg via INTRAVENOUS

## 2023-04-03 MED ORDER — FENTANYL CITRATE (PF) 100 MCG/2ML IJ SOLN
INTRAMUSCULAR | Status: AC
  Filled 2023-04-03: qty 2

## 2023-04-03 MED ORDER — OXYCODONE HCL 5 MG PO TABS
5.0000 mg | ORAL_TABLET | ORAL | Status: DC | PRN
  Administered 2023-04-04 – 2023-04-07 (×5): 5 mg via ORAL
  Filled 2023-04-03 (×8): qty 1

## 2023-04-03 MED ORDER — FENTANYL CITRATE (PF) 250 MCG/5ML IJ SOLN
INTRAMUSCULAR | Status: DC | PRN
  Administered 2023-04-03: 25 ug via INTRAVENOUS
  Administered 2023-04-03: 100 ug via INTRAVENOUS

## 2023-04-03 MED ORDER — CHLORHEXIDINE GLUCONATE 0.12 % MT SOLN: 15.0000 mL | Freq: Once | OROMUCOSAL | Status: AC

## 2023-04-03 MED ORDER — CHLORHEXIDINE GLUCONATE 0.12 % MT SOLN
OROMUCOSAL | Status: AC
  Administered 2023-04-03: 15 mL via OROMUCOSAL
  Filled 2023-04-03: qty 15

## 2023-04-03 MED ORDER — ONDANSETRON HCL 4 MG/2ML IJ SOLN: 4.0000 mg | Freq: Four times a day (QID) | INTRAMUSCULAR | Status: DC | PRN

## 2023-04-03 MED ORDER — EPHEDRINE 5 MG/ML INJ
INTRAVENOUS | Status: AC
  Filled 2023-04-03: qty 5

## 2023-04-03 MED ORDER — 0.9 % SODIUM CHLORIDE (POUR BTL) OPTIME
TOPICAL | Status: DC | PRN
  Administered 2023-04-03: 1000 mL

## 2023-04-03 MED ORDER — ONDANSETRON HCL 4 MG PO TABS: 4.0000 mg | ORAL_TABLET | Freq: Four times a day (QID) | ORAL | Status: DC | PRN

## 2023-04-03 MED ORDER — CEFAZOLIN SODIUM-DEXTROSE 1-4 GM/50ML-% IV SOLN
1.0000 g | Freq: Three times a day (TID) | INTRAVENOUS | Status: AC
  Administered 2023-04-04 (×3): 1 g via INTRAVENOUS
  Filled 2023-04-03 (×3): qty 50

## 2023-04-03 MED ORDER — SODIUM CHLORIDE 0.9 % IV BOLUS
1000.0000 mL | Freq: Once | INTRAVENOUS | Status: AC
  Administered 2023-04-03: 1000 mL via INTRAVENOUS

## 2023-04-03 MED ORDER — DROPERIDOL 2.5 MG/ML IJ SOLN: 0.6250 mg | Freq: Once | INTRAMUSCULAR | Status: DC | PRN

## 2023-04-03 MED ORDER — ROCURONIUM BROMIDE 10 MG/ML (PF) SYRINGE
PREFILLED_SYRINGE | INTRAVENOUS | Status: DC | PRN
  Administered 2023-04-03: 70 mg via INTRAVENOUS

## 2023-04-03 MED ORDER — MORPHINE SULFATE (PF) 4 MG/ML IV SOLN
4.0000 mg | INTRAVENOUS | Status: DC | PRN
  Administered 2023-04-03 – 2023-04-06 (×7): 4 mg via INTRAVENOUS
  Filled 2023-04-03 (×7): qty 1

## 2023-04-03 MED ORDER — ONDANSETRON HCL 4 MG/2ML IJ SOLN
INTRAMUSCULAR | Status: AC
  Filled 2023-04-03: qty 2

## 2023-04-03 MED ORDER — CHLORHEXIDINE GLUCONATE 4 % EX SOLN: 60.0000 mL | Freq: Once | CUTANEOUS | Status: DC

## 2023-04-03 MED ORDER — FENTANYL CITRATE (PF) 100 MCG/2ML IJ SOLN
25.0000 ug | INTRAMUSCULAR | Status: DC | PRN
  Administered 2023-04-03: 50 ug via INTRAVENOUS

## 2023-04-03 MED ORDER — GLYCOPYRROLATE PF 0.2 MG/ML IJ SOSY
PREFILLED_SYRINGE | INTRAMUSCULAR | Status: DC | PRN
Start: 2023-04-03 — End: 2023-04-03
  Administered 2023-04-03: .1 mg via INTRAVENOUS

## 2023-04-03 MED ORDER — TRANEXAMIC ACID-NACL 1000-0.7 MG/100ML-% IV SOLN
INTRAVENOUS | Status: AC
  Filled 2023-04-03: qty 100

## 2023-04-03 MED ORDER — ACETAMINOPHEN 650 MG RE SUPP: 650.0000 mg | Freq: Four times a day (QID) | RECTAL | Status: DC | PRN

## 2023-04-03 MED ORDER — POVIDONE-IODINE 10 % EX SWAB
2.0000 | Freq: Once | CUTANEOUS | Status: AC
  Administered 2023-04-03: 2 via TOPICAL

## 2023-04-03 MED ORDER — PHENYLEPHRINE HCL-NACL 20-0.9 MG/250ML-% IV SOLN
INTRAVENOUS | Status: DC | PRN
  Administered 2023-04-03: 30 ug/min via INTRAVENOUS

## 2023-04-03 MED ORDER — GLYCOPYRROLATE PF 0.2 MG/ML IJ SOSY
PREFILLED_SYRINGE | INTRAMUSCULAR | Status: AC
  Filled 2023-04-03: qty 1

## 2023-04-03 MED ORDER — LIDOCAINE 2% (20 MG/ML) 5 ML SYRINGE
INTRAMUSCULAR | Status: DC | PRN
  Administered 2023-04-03: 100 mg via INTRAVENOUS

## 2023-04-03 MED ORDER — ACETAMINOPHEN 10 MG/ML IV SOLN: 1000.0000 mg | Freq: Once | INTRAVENOUS | Status: DC | PRN

## 2023-04-03 MED ORDER — ACETAMINOPHEN 325 MG PO TABS
650.0000 mg | ORAL_TABLET | Freq: Four times a day (QID) | ORAL | Status: DC | PRN
  Administered 2023-04-05 (×2): 650 mg via ORAL
  Filled 2023-04-03 (×2): qty 2

## 2023-04-03 SURGICAL SUPPLY — 48 items
ALCOHOL 70% 16 OZ (MISCELLANEOUS) ×2 IMPLANT
BAG COUNTER SPONGE SURGICOUNT (BAG) ×2 IMPLANT
BAG SPNG CNTER NS LX DISP (BAG) ×1
BIT DRILL INTERTAN LAG SCREW (BIT) IMPLANT
BIT DRILL LONG 4.0 (BIT) IMPLANT
BNDG CMPR 5X6 CHSV STRCH STRL (GAUZE/BANDAGES/DRESSINGS)
BNDG COHESIVE 6X5 TAN ST LF (GAUZE/BANDAGES/DRESSINGS) ×4 IMPLANT
CANISTER SUCT 3000ML PPV (MISCELLANEOUS) ×2 IMPLANT
COVER PERINEAL POST (MISCELLANEOUS) ×2 IMPLANT
COVER SURGICAL LIGHT HANDLE (MISCELLANEOUS) ×2 IMPLANT
DRAPE C-ARM 42X72 X-RAY (DRAPES) ×2 IMPLANT
DRAPE C-ARMOR (DRAPES) IMPLANT
DRAPE HALF SHEET 40X57 (DRAPES) IMPLANT
DRAPE INCISE IOBAN 66X45 STRL (DRAPES) ×2 IMPLANT
DRAPE STERI IOBAN 125X83 (DRAPES) ×2 IMPLANT
DRILL BIT LONG 4.0 (BIT) ×1
DRSG ADAPTIC 3X8 NADH LF (GAUZE/BANDAGES/DRESSINGS) ×2 IMPLANT
DRSG TEGADERM 4X4.75 (GAUZE/BANDAGES/DRESSINGS) IMPLANT
DRSG XEROFORM 1X8 (GAUZE/BANDAGES/DRESSINGS) IMPLANT
DURAPREP 26ML APPLICATOR (WOUND CARE) ×2 IMPLANT
ELECT CAUTERY BLADE 6.4 (BLADE) ×2 IMPLANT
ELECT REM PT RETURN 9FT ADLT (ELECTROSURGICAL) ×1
ELECTRODE REM PT RTRN 9FT ADLT (ELECTROSURGICAL) ×2 IMPLANT
GAUZE SPONGE 4X4 12PLY STRL (GAUZE/BANDAGES/DRESSINGS) IMPLANT
GAUZE SPONGE 4X4 12PLY STRL LF (GAUZE/BANDAGES/DRESSINGS) ×2 IMPLANT
GLOVE BIO SURGEON STRL SZ7.5 (GLOVE) ×4 IMPLANT
GLOVE BIOGEL PI IND STRL 8 (GLOVE) ×4 IMPLANT
GOWN STRL REUS W/ TWL LRG LVL3 (GOWN DISPOSABLE) ×2 IMPLANT
GOWN STRL REUS W/TWL LRG LVL3 (GOWN DISPOSABLE) ×1
GUIDE PIN 3.2X343 (PIN) ×2
GUIDE PIN 3.2X343MM (PIN) ×2
KIT BASIN OR (CUSTOM PROCEDURE TRAY) ×2 IMPLANT
KIT TURNOVER KIT B (KITS) ×2 IMPLANT
NAIL TRIGEN INTERTAN 10X18CM (Nail) IMPLANT
NS IRRIG 1000ML POUR BTL (IV SOLUTION) ×2 IMPLANT
PACK GENERAL/GYN (CUSTOM PROCEDURE TRAY) ×2 IMPLANT
PAD ARMBOARD 7.5X6 YLW CONV (MISCELLANEOUS) ×4 IMPLANT
PIN GUIDE 3.2X343MM (PIN) IMPLANT
SCREW LAG COMPR KIT 95/90 (Screw) IMPLANT
SCREW TRIGEN LOW PROF 5.0X35 (Screw) IMPLANT
STAPLER VISISTAT 35W (STAPLE) ×2 IMPLANT
SUT MON AB 2-0 CT1 36 (SUTURE) ×2 IMPLANT
SUT VIC AB 0 CT1 27 (SUTURE) ×1
SUT VIC AB 0 CT1 27XBRD ANBCTR (SUTURE) IMPLANT
SUT VIC AB 2-0 CT1 (SUTURE) IMPLANT
TOWEL GREEN STERILE (TOWEL DISPOSABLE) ×2 IMPLANT
TOWEL GREEN STERILE FF (TOWEL DISPOSABLE) ×2 IMPLANT
WATER STERILE IRR 1000ML POUR (IV SOLUTION) ×2 IMPLANT

## 2023-04-03 NOTE — Op Note (Signed)
.  Orthopaedic Surgery Operative Note (CSN: [id number])  Andrew Knight  May 16, 1946 Date of Surgery: 04/03/2023   Diagnoses:  Right reverse obliquity intertrochanteric hip fracture  Procedure: Right hip intramedullary nailing   Operative Finding Successful completion of the planned procedure.    Post-operative plan: The patient will be WBAT.  The patient will be on ancef 1g q8 hours for 3 doses for surgical prophylaxis.  DVT prophylaxis per primary team, no orthopedic contraindications, recommend aspirin 81mg  BID.   Pain control with PRN pain medication preferring oral medicines.  Follow up plan will be scheduled in approximately 14 days for incision check and XR.  Post-Op Diagnosis: Same Surgeons:Primary: Luci Bank, MD Assistants:Caroline McBane PA-C Location: [facility] OR ROOM 03 Anesthesia: General Antibiotics: Ancef 2 g Tourniquet time: N/A Estimated Blood Loss: 200 cc Complications: None Specimens: None Implants: Implant Name Type Inv. Item Serial No. Manufacturer Lot No. LRB No. Used Action  NAIL Otis Peak 10X18CM - [id number] Nail NAIL [employer] [id number] Right 1 Implanted  SCREW LAG COMPR KIT 95/90 - [id number] Screw SCREW LAG COMPR KIT 95/90  SMITH [employer] [id number] Right 1 Implanted  SCREW TRIGEN LOW PROF 5.0X35 - [id number] Screw SCREW TRIGEN LOW PROF 5.0X35  [employer] [id number] Right 1 Implanted    Indications for Surgery:   Andrew Knight is a 77 y.o. male who had a ground level fall while working in his driveway and sustained a right hip reverse obliquity intertrochanteric hip fracture/.  Benefits and risks of operative and nonoperative management were discussed prior to surgery with patient and informed consent form was completed.  Specific risks including bleeding, infection, damage to nearby structures, need for additional surgery, painful hardware, malunion, nonunion, difficulty  ambulating and anesthetic complications including myocardial infarction, stroke or even death/   Procedure:   The patient was identified properly. Informed consent was obtained and the surgical site was marked. The patient was taken up to suite where general anesthesia was induced.  The patient was positioned supine on a fracture table and appropriate reduction was obtained and visualized on fluoroscopy prior to the beginning of the procedure.  Timeout was performed before the beginning of the case.  We made an incision proximal to the greater trochanter and dissected down through the fascia.  We then carefully placed our starting guidewire localizing under fluoroscopy prior to advancing the wire into the bone. The wire was passed to an appropriate level and entry reamer was used.  We selected a length of nail noted above.  At this point we placed our nail localizing under fluoroscopy that it was at the appropriate level prior to using the outrigger device to pass a wire and then the cephalo-medullary screws.  The screws were locked proximally to avoid over collapse.  We took final shots at the proximal femur and then used the outrigger to place one distal interlock screw.  Final pictures were obtained.  The wounds were thoroughly irrigated closed in a multilayer fashion with absorable sutures.  A sterile dressing was placed.  The patient was awoken from general anesthesia and taken to the PACU in stable condition without complication.    Luci Bank 04/03/2023, 6:47 PM Orthopaedic Surgery

## 2023-04-03 NOTE — Anesthesia Procedure Notes (Signed)
Procedure Name: Intubation Date/Time: 04/03/2023 5:14 PM  Performed by: Cy Blamer, CRNAPre-anesthesia Checklist: Patient identified, Emergency Drugs available, Suction available and Patient being monitored Patient Re-evaluated:Patient Re-evaluated prior to induction Oxygen Delivery Method: Circle system utilized Preoxygenation: Pre-oxygenation with 100% oxygen Induction Type: IV induction Ventilation: Mask ventilation without difficulty Laryngoscope Size: Glidescope and 4 Grade View: Grade I Tube type: Oral Tube size: 7.5 mm Number of attempts: 2 Airway Equipment and Method: Rigid stylet, Video-laryngoscopy and Bite block Placement Confirmation: ETT inserted through vocal cords under direct vision, positive ETCO2 and breath sounds checked- equal and bilateral Secured at: 23 cm Tube secured with: Tape Dental Injury: Teeth and Oropharynx as per pre-operative assessment  Difficulty Due To: Difficult Airway- due to anterior larynx Comments: 1st attempt Grade III view w/Miller 2 & BURP maneuver, unable to advance ETT.

## 2023-04-03 NOTE — Plan of Care (Signed)
  Problem: Education: Goal: Knowledge of General Education information will improve Description: Including pain rating scale, medication(s)/side effects and non-pharmacologic comfort measures Outcome: Progressing   Problem: Clinical Measurements: Goal: Respiratory complications will improve Outcome: Progressing Goal: Cardiovascular complication will be avoided Outcome: Progressing   

## 2023-04-03 NOTE — ED Triage Notes (Signed)
Cleaning leaves off the driveway reach down to pick up the leaves and fell landing on right side no LOC no thinners cannot move the right leg pulses strong alert and oriented at this time deformity to the back of right thigh
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
## 2023-04-03 NOTE — ED Provider Notes (Signed)
[place] EMERGENCY DEPARTMENT AT [facility] Provider Note   CSN: [id number] Arrival date & time: 04/03/23  1143     History  Chief Complaint  Patient presents with   Marletta Lor    Andrew Knight is a 77 y.o. male.  Who presents to the ED after fall.  He was picking up leaves in his driveway earlier today when he lost his balance and fell onto his right side.  He was unable to get up under his own power secondary to severe pain in his right hip.  No head strike or loss of consciousness.  No other injuries reported.   Fall       Home Medications Prior to Admission medications   Medication Sig Start Date End Date Taking? Authorizing Provider  amoxicillin (AMOXIL) 500 MG capsule Take 1,000 mg by mouth 4 (four) times daily. X10 day supply. 04/01/23  Yes [provider]  aspirin 81 MG tablet Take 81 mg by mouth daily.   Yes [provider]  b complex vitamins capsule Take 1 capsule by mouth daily.   Yes [provider]  Cholecalciferol (VITAMIN D PO) Take 1 tablet by mouth daily.   Yes [provider]      Allergies    Patient has no known allergies.    Review of Systems   Review of Systems  Musculoskeletal:        Right hip pain  All other systems reviewed and are negative.   Physical Exam Updated Vital Signs BP 128/68   Pulse (!) 52   Temp (!) 97.4 F (36.3 C) (Oral)   Resp 15   Ht 5\' 11"  (1.803 m)   Wt 90.7 kg   SpO2 100%   BMI 27.88 kg/m  Physical Exam Vitals and nursing note reviewed.  HENT:     Head: Normocephalic and atraumatic.  Eyes:     Pupils: Pupils are equal, round, and reactive to light.  Cardiovascular:     Rate and Rhythm: Normal rate and regular rhythm.  Pulmonary:     Effort: Pulmonary effort is normal.     Breath sounds: Normal breath sounds.  Abdominal:     Palpations: Abdomen is soft.     Tenderness: There is no abdominal tenderness.  Musculoskeletal:        General: Deformity present.      Comments: Shortening and internal rotation of right lower extremity Tenderness to palpation over right hip joint Limited range of motion in the right hip with attempted flexion 2+ DP pulses bilaterally Sensation intact to light touch to bilateral lower extremities  Skin:    General: Skin is warm and dry.  Neurological:     Mental Status: He is alert.  Psychiatric:        Mood and Affect: Mood normal.     ED Results / Procedures / Treatments   Labs (all labs ordered are listed, but only abnormal results are displayed) Labs Reviewed  BASIC METABOLIC PANEL - Abnormal; Notable for the following components:      Result Value   CO2 21 (*)    Glucose, Bld 135 (*)    All other components within normal limits  CBC WITH DIFFERENTIAL/PLATELET - Abnormal; Notable for the following components:   WBC 10.7 (*)    Neutro Abs 9.1 (*)    All other components within normal limits    EKG EKG Interpretation Date/Time:  Thursday April 03 2023 12:59:20 EDT Ventricular Rate:  52 PR Interval:  186 QRS Duration:  110 QT Interval:  469 QTC Calculation: 437 R Axis:   -15  Text Interpretation: Sinus rhythm Borderline left axis deviation Confirmed by Estelle June [id number]) on 04/03/2023 2:27:04 PM  Radiology No results found.  Procedures Procedures    Medications Ordered in ED Medications  morphine (PF) 4 MG/ML injection 4 mg (4 mg Intravenous Given 04/03/23 1259)  sodium chloride 0.9 % bolus 1,000 mL (1,000 mLs Intravenous Bolus 04/03/23 1313)  ondansetron (ZOFRAN) injection 4 mg (4 mg Intravenous Given 04/03/23 1258)    ED Course/ Medical Decision Making/ A&P Clinical Course as of 04/03/23 1428  Thu Apr 03, 2023  1427 Right pelvis and right femur x-ray interpreted independently by me.  Intertrochanteric fracture with minimal displacement.  Discussed with orthopedic PA on-call who is evaluated patient in the ED.  Plan for operative fixation at 1700 today.  Patient will then be admitted  to medicine.  Discussed with admitting hospitalist accepts patient for admission to medicine [MP]    Clinical Course User Index [MP] Royanne Foots, DO                                 Medical Decision Making Amount and/or Complexity of Data Reviewed Labs: ordered. Radiology: ordered.  Risk Prescription drug management. Decision regarding hospitalization.           Final Clinical Impression(s) / ED Diagnoses Final diagnoses:  Closed nondisplaced intertrochanteric fracture of right femur, initial encounter [facility])    Rx / DC Orders ED Discharge Orders     None         Royanne Foots, DO 04/03/23 1428

## 2023-04-03 NOTE — Transfer of Care (Signed)
Immediate Anesthesia Transfer of Care Note  Patient: Andrew Knight  Procedure(s) Performed: INTRAMEDULLARY (IM) NAILING OF RIGHT FEMUR (Right: Hip)  Patient Location: PACU  Anesthesia Type:General  Level of Consciousness: awake, alert , and oriented  Airway & Oxygen Therapy: Patient Spontanous Breathing  Post-op Assessment: Report given to RN, Post -op Vital signs reviewed and stable, Patient moving all extremities X 4, and Patient able to stick tongue midline  Post vital signs: Reviewed and stable  Last Vitals:  Vitals Value Taken Time  BP 151/82 04/03/23 1834  Temp 97.3   Pulse 99 04/03/23 1836  Resp 14 04/03/23 1836  SpO2 95 % 04/03/23 1836  Vitals shown include unfiled device data.  Last Pain:  Vitals:   04/03/23 1645  TempSrc: Oral  PainSc:          Complications: No notable events documented.

## 2023-04-03 NOTE — ED Notes (Signed)
Xray notified pt is ready 
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
## 2023-04-03 NOTE — Anesthesia Preprocedure Evaluation (Signed)
Anesthesia Evaluation  Patient identified by MRN, date of birth, ID band Patient awake    Reviewed: Allergy & Precautions, H&P , NPO status , Patient's Chart, lab work & pertinent test results  Airway Mallampati: II  TM Distance: >3 FB Neck ROM: Full    Dental no notable dental hx.    Pulmonary neg pulmonary ROS, former smoker   Pulmonary exam normal breath sounds clear to auscultation       Cardiovascular negative cardio ROS Normal cardiovascular exam Rhythm:Regular Rate:Normal     Neuro/Psych negative neurological ROS  negative psych ROS   GI/Hepatic negative GI ROS, Neg liver ROS,,,  Endo/Other  negative endocrine ROS    Renal/GU negative Renal ROS  negative genitourinary   Musculoskeletal  (+) Arthritis ,    Abdominal   Peds negative pediatric ROS (+)  Hematology negative hematology ROS (+)   Anesthesia Other Findings   Reproductive/Obstetrics negative OB ROS                              Anesthesia Physical Anesthesia Plan  ASA: 2  Anesthesia Plan: General   Post-op Pain Management:    Induction: Intravenous  PONV Risk Score and Plan: Ondansetron and Dexamethasone  Airway Management Planned: Oral ETT  Additional Equipment:   Intra-op Plan:   Post-operative Plan: Extubation in OR  Informed Consent: I have reviewed the patients History and Physical, chart, labs and discussed the procedure including the risks, benefits and alternatives for the proposed anesthesia with the patient or authorized representative who has indicated his/her understanding and acceptance.     Dental advisory given  Plan Discussed with: CRNA  Anesthesia Plan Comments: (Right hip fx following ground level fall)         Anesthesia Quick Evaluation

## 2023-04-03 NOTE — Brief Op Note (Signed)
04/03/2023  6:39 PM  PATIENT:  Hilliard Clark Kathol  77 y.o. male  PRE-OPERATIVE DIAGNOSIS:  right hip fracture  POST-OPERATIVE DIAGNOSIS:  right hip fracture  PROCEDURE:  Procedure(s): INTRAMEDULLARY (IM) NAILING OF RIGHT FEMUR (Right)  SURGEON:  Surgeons and Role:    * Luci Bank, MD - Primary   ASSISTANTS: none   ANESTHESIA:   general  EBL:  200 mL   BLOOD ADMINISTERED:none  DRAINS: none   LOCAL MEDICATIONS USED:  NONE  SPECIMEN:  No Specimen  DISPOSITION OF SPECIMEN:  N/A  COUNTS:  YES  TOURNIQUET:  * No tourniquets in log *  DICTATION: .Note written in EPIC  PLAN OF CARE: Admit to inpatient   PATIENT DISPOSITION:  PACU - hemodynamically stable.   Delay start of Pharmacological VTE agent (>24hrs) due to surgical blood loss or risk of bleeding: not applicable

## 2023-04-04 ENCOUNTER — Encounter (HOSPITAL_COMMUNITY): Payer: Self-pay | Admitting: Orthopedic Surgery

## 2023-04-04 DIAGNOSIS — S7224XA Nondisplaced subtrochanteric fracture of right femur, initial encounter for closed fracture: Secondary | ICD-10-CM | POA: Diagnosis not present

## 2023-04-04 MED ORDER — ASPIRIN 81 MG PO TBEC
81.0000 mg | DELAYED_RELEASE_TABLET | Freq: Two times a day (BID) | ORAL | Status: DC
  Administered 2023-04-04 – 2023-04-07 (×6): 81 mg via ORAL
  Filled 2023-04-04 (×6): qty 1

## 2023-04-04 NOTE — Progress Notes (Signed)
.  Subjective: 1 Day Post-Op Procedure(s) (LRB): INTRAMEDULLARY (IM) NAILING OF RIGHT FEMUR (Right)  Activity level:  WBAT Diet tolerance:  as tolerated Voiding:  as tolerated Patient reports pain as moderate.    Objective: Vital signs in last 24 hours: Temp:  [97.3 F (36.3 C)-98.9 F (37.2 C)] 98.9 F (37.2 C) (09/13 1356) Pulse Rate:  [64-101] 85 (09/13 1356) Resp:  [11-18] 18 (09/13 1356) BP: (102-151)/(63-87) 109/63 (09/13 1356) SpO2:  [92 %-99 %] 99 % (09/13 0754)  Labs: Recent Labs    04/03/23 1308  HGB 15.4   Recent Labs    04/03/23 1308  WBC 10.7*  RBC 4.72  HCT 44.5  PLT 209   Recent Labs    04/03/23 1308  NA 136  K 3.9  CL 104  CO2 21*  BUN 16  CREATININE 0.89  GLUCOSE 135*  CALCIUM 9.3   No results for input(s): "LABPT", "INR" in the last 72 hours.  Physical Exam:  Neurologically intact ABD soft Neurovascular intact Sensation intact distally Intact pulses distally Dorsiflexion/Plantar flexion intact Incision: dressing C/D/I  Assessment/Plan:  1 Day Post-Op Procedure(s) (LRB): INTRAMEDULLARY (IM) NAILING OF RIGHT FEMUR (Right) WBAT Rehab/PT/OT DVT ppx per primary, recommend asa 81mg  BID for 4 weeks post op Rest of management per medical team   Luci Bank 04/04/2023, 4:44 PM

## 2023-04-04 NOTE — Evaluation (Signed)
Physical Therapy Evaluation Patient Details Name: Andrew Knight MRN: [id number] DOB: 1946-04-09 Today's Date: 04/04/2023  History of Present Illness  77 y.o. male who who presents emergency department after a fall.  PMH includes: R knee scope, L RCR, R RCT, vertigo.  Clinical Impression  Pt presents to PT with deficits in functional mobility, strength, balance, power, endurance. PT notes significant weakness in R hip and knee muscles at this time. PT provides HEP to improve activation of these muscles. Pt requires significantly increased time and physical assistance to perform bed mobility and transfers. Pt reports wooziness in standing, borderline orthostatic BP once recorded again in sitting. PT recommends short term inpatient PT placement at the time of discharge as the pt's spouse is unable to physically assist him.         If plan is discharge home, recommend the following: A lot of help with walking and/or transfers;A lot of help with bathing/dressing/bathroom;Assistance with cooking/housework;Assist for transportation;Help with stairs or ramp for entrance   Can travel by private vehicle   No    Equipment Recommendations Rolling walker (2 wheels);BSC/3in1  Recommendations for Other Services       Functional Status Assessment Patient has had a recent decline in their functional status and demonstrates the ability to make significant improvements in function in a reasonable and predictable amount of time.     Precautions / Restrictions Precautions Precautions: Fall Precaution Comments: Watch BP Restrictions Weight Bearing Restrictions: Yes RLE Weight Bearing: Weight bearing as tolerated      Mobility  Bed Mobility Overal bed mobility: Needs Assistance Bed Mobility: Supine to Sit, Sit to Supine     Supine to sit: Min assist, HOB elevated, Used rails Sit to supine: Min assist   General bed mobility comments: significantly increased time for bed mobility, verbal and  tactile cues for sequencing    Transfers Overall transfer level: Needs assistance Equipment used: Rolling walker (2 wheels) Transfers: Sit to/from Stand Sit to Stand: Min assist   Step pivot transfers:  (unable to tolerates step-pivot due to reports of wooziness)            Ambulation/Gait                  Stairs            Wheelchair Mobility     Tilt Bed    Modified Rankin (Stroke Patients Only)       Balance Overall balance assessment: Needs assistance Sitting-balance support: No upper extremity supported, Feet supported Sitting balance-Leahy Scale: Fair     Standing balance support: Bilateral upper extremity supported, Reliant on assistive device for balance Standing balance-Leahy Scale: Poor                               Pertinent Vitals/Pain Pain Assessment Pain Assessment: Faces Faces Pain Scale: Hurts whole lot Pain Location: RLE Pain Descriptors / Indicators: Grimacing Pain Intervention(s): Monitored during session    Home Living Family/patient expects to be discharged to:: Private residence Living Arrangements: Spouse/significant other Available Help at Discharge: Family;Available 24 hours/day Type of Home: House Home Access: Level entry       Home Layout: Able to live on main level with bedroom/bathroom;Other (Comment) Home Equipment: Shower seat;Grab bars - tub/shower      Prior Function Prior Level of Function : Independent/Modified Independent;Driving  Extremity/Trunk Assessment   Upper Extremity Assessment Upper Extremity Assessment: Overall WFL for tasks assessed    Lower Extremity Assessment Lower Extremity Assessment: RLE deficits/detail RLE Deficits / Details: significant post-op weakness of hip and knee muscles. PT notes flicker of quad activation and hamstring activation, 2-/5 hip abduction/adduction    Cervical / Trunk Assessment Cervical / Trunk Assessment: Normal   Communication   Communication Communication: No apparent difficulties Cueing Techniques: Verbal cues  Cognition Arousal: Alert Behavior During Therapy: WFL for tasks assessed/performed Overall Cognitive Status: Within Functional Limits for tasks assessed                                          General Comments General comments (skin integrity, edema, etc.): BP pre-mobility 132/70, BP post-mobility sitting at edge of bed 114/73    Exercises General Exercises - Lower Extremity Ankle Circles/Pumps: AROM, Both, 10 reps Quad Sets: AROM, Right, 5 reps Short Arc Quad: AROM, Right, 5 reps Heel Slides: AAROM, Right, 5 reps Hip ABduction/ADduction: AROM, Right   Assessment/Plan    PT Assessment Patient needs continued PT services  PT Problem List Decreased strength;Decreased balance;Decreased range of motion;Decreased activity tolerance;Decreased mobility;Decreased knowledge of use of DME;Decreased safety awareness;Pain       PT Treatment Interventions DME instruction;Gait training;Functional mobility training;Therapeutic activities;Therapeutic exercise;Balance training;Neuromuscular re-education;Patient/family education    PT Goals (Current goals can be found in the Care Plan section)  Acute Rehab PT Goals Patient Stated Goal: to return to independence PT Goal Formulation: With patient Time For Goal Achievement: 04/18/23 Potential to Achieve Goals: Fair    Frequency Min 1X/week     Co-evaluation               AM-PAC PT "6 Clicks" Mobility  Outcome Measure Help needed turning from your back to your side while in a flat bed without using bedrails?: A Little Help needed moving from lying on your back to sitting on the side of a flat bed without using bedrails?: A Little Help needed moving to and from a bed to a chair (including a wheelchair)?: A Little Help needed standing up from a chair using your arms (e.g., wheelchair or bedside chair)?: A  Little Help needed to walk in hospital room?: Total Help needed climbing 3-5 steps with a railing? : Total 6 Click Score: 14    End of Session Equipment Utilized During Treatment: Gait belt Activity Tolerance: Treatment limited secondary to medical complications (Comment) (wooziness, borderline orthostatic BP) Patient left: in bed;with call bell/phone within reach;with bed alarm set;with family/visitor present Nurse Communication: Mobility status PT Visit Diagnosis: Other abnormalities of gait and mobility (R26.89);Muscle weakness (generalized) (M62.81)    Time: 1610-9604 PT Time Calculation (min) (ACUTE ONLY): 42 min   Charges:   PT Evaluation $PT Eval Low Complexity: 1 Low PT Treatments $Therapeutic Exercise: 8-22 mins PT General Charges $$ ACUTE PT VISIT: 1 Visit         Arlyss Gandy, PT, DPT Acute Rehabilitation Office [phone]   Arlyss Gandy 04/04/2023, 5:16 PM

## 2023-04-04 NOTE — Plan of Care (Signed)
Problem: Health Behavior/Discharge Planning: Goal: Ability to manage health-related needs will improve Outcome: Progressing   Problem: Activity: Goal: Risk for activity intolerance will decrease Outcome: Progressing   Problem: Coping: Goal: Level of anxiety will decrease Outcome: Progressing   Problem: Pain Managment: Goal: General experience of comfort will improve Outcome: Progressing

## 2023-04-04 NOTE — Evaluation (Signed)
Occupational Therapy Evaluation Patient Details Name: Andrew Knight MRN: [id number] DOB: June 29, 1946 Today's Date: 04/04/2023   History of Present Illness 77 y.o. male who who presents emergency department after a fall.  PMH includes: R knee scope, L RCR, R RCT, vertigo.   Clinical Impression   Patient admitted for the diagnosis above.  PTA he lives at home with his spouse, who can only provide supportive assist, no physical assist, and remained independent with ADL,iADL and mobility.  Currently pain and dizziness are limiting independence, needing up to Mod A for basic mobility and lower body ADL.  OT will follow in the acute setting to address deficits, and Patient will benefit from continued inpatient follow up therapy, <3 hours/day        If plan is discharge home, recommend the following: Assist for transportation;Assistance with cooking/housework;A lot of help with walking and/or transfers;A lot of help with bathing/dressing/bathroom    Functional Status Assessment  Patient has had a recent decline in their functional status and demonstrates the ability to make significant improvements in function in a reasonable and predictable amount of time.  Equipment Recommendations  BSC/3in1    Recommendations for Other Services       Precautions / Restrictions Precautions Precautions: Fall Precaution Comments: Watch BP Restrictions Weight Bearing Restrictions: Yes RLE Weight Bearing: Weight bearing as tolerated      Mobility Bed Mobility Overal bed mobility: Needs Assistance Bed Mobility: Supine to Sit     Supine to sit: Mod assist, HOB elevated          Transfers Overall transfer level: Needs assistance Equipment used: Rolling walker (2 wheels) Transfers: Sit to/from Stand, Bed to chair/wheelchair/BSC Sit to Stand: Mod assist, Min assist     Step pivot transfers: Min assist            Balance Overall balance assessment: Needs assistance Sitting-balance  support: Feet supported, Single extremity supported Sitting balance-Leahy Scale: Fair   Postural control: Left lateral lean Standing balance support: Reliant on assistive device for balance Standing balance-Leahy Scale: Poor                             ADL either performed or assessed with clinical judgement   ADL Overall ADL's : Needs assistance/impaired Eating/Feeding: Independent;Bed level   Grooming: Wash/dry hands;Wash/dry face;Set up;Sitting   Upper Body Bathing: Set up;Sitting   Lower Body Bathing: Maximal assistance;Sitting/lateral leans;Sit to/from stand   Upper Body Dressing : Set up;Sitting   Lower Body Dressing: Maximal assistance;Sitting/lateral leans;Sit to/from stand   Toilet Transfer: Moderate assistance;Stand-pivot;Rolling walker (2 wheels);BSC/3in1                   Vision Baseline Vision/History: 1 Wears glasses Patient Visual Report: No change from baseline       Perception Perception: Within Functional Limits       Praxis Praxis: WFL       Pertinent Vitals/Pain Pain Assessment Pain Assessment: Faces Faces Pain Scale: Hurts whole lot Pain Location: R leg Pain Descriptors / Indicators: Grimacing, Guarding, Sharp Pain Intervention(s): Premedicated before session     Extremity/Trunk Assessment Upper Extremity Assessment Upper Extremity Assessment: Overall WFL for tasks assessed   Lower Extremity Assessment Lower Extremity Assessment: Defer to PT evaluation       Communication     Cognition Arousal: Alert Behavior During Therapy: [facility] for tasks assessed/performed Overall Cognitive Status: Within Functional Limits for tasks assessed  General Comments   + dizziness with BP 106/61    Exercises     Shoulder Instructions      Home Living Family/patient expects to be discharged to:: Private residence Living Arrangements: Spouse/significant other Available Help at  Discharge: Family;Available 24 hours/day Type of Home: House Home Access: Level entry     Home Layout: Able to live on main level with bedroom/bathroom;Other (Comment) (Finished basement)     Bathroom Shower/Tub: Walk-in shower;Tub/shower unit   Bathroom Toilet: Handicapped height Bathroom Accessibility: Yes How Accessible: Accessible via walker Home Equipment: Shower seat;Grab bars - tub/shower          Prior Functioning/Environment Prior Level of Function : Independent/Modified Independent;Driving                        OT Problem List: Decreased strength;Decreased range of motion;Decreased activity tolerance;Impaired balance (sitting and/or standing);Pain      OT Treatment/Interventions: Self-care/ADL training;Therapeutic activities;Patient/family education;Energy conservation;Balance training;DME and/or AE instruction    OT Goals(Current goals can be found in the care plan section) Acute Rehab OT Goals Patient Stated Goal: Return home OT Goal Formulation: With patient Time For Goal Achievement: 04/18/23 Potential to Achieve Goals: Good ADL Goals Pt Will Perform Grooming: (P) with supervision;standing Pt Will Perform Lower Body Dressing: (P) with min assist;sit to/from stand Pt Will Transfer to Toilet: (P) with supervision;ambulating;regular height toilet  OT Frequency: Min 1X/week    Co-evaluation              AM-PAC OT "6 Clicks" Daily Activity     Outcome Measure Help from another person eating meals?: None Help from another person taking care of personal grooming?: None Help from another person toileting, which includes using toliet, bedpan, or urinal?: A Lot Help from another person bathing (including washing, rinsing, drying)?: A Lot Help from another person to put on and taking off regular upper body clothing?: None Help from another person to put on and taking off regular lower body clothing?: A Lot 6 Click Score: 18   End of Session  Equipment Utilized During Treatment: Gait belt;Rolling walker (2 wheels) Nurse Communication: Mobility status  Activity Tolerance: Other (comment) (Limited by dizziness) Patient left: in bed;with call bell/phone within reach;with family/visitor present  OT Visit Diagnosis: Unsteadiness on feet (R26.81);Dizziness and giddiness (R42)                Time: 3329-5188 OT Time Calculation (min): 22 min Charges:  OT General Charges $OT Visit: 1 Visit OT Evaluation $OT Eval Moderate Complexity: 1 Mod  04/04/2023  RP, OTR/L  Acute Rehabilitation Services  Office:  [phone]   Suzanna Obey 04/04/2023, 2:08 PM

## 2023-04-04 NOTE — Anesthesia Postprocedure Evaluation (Signed)
Anesthesia Post Note  Patient: Andrew Knight  Procedure(s) Performed: INTRAMEDULLARY (IM) NAILING OF RIGHT FEMUR (Right: Hip)     Patient location during evaluation: PACU Anesthesia Type: General Level of consciousness: awake Pain management: pain level controlled Vital Signs Assessment: post-procedure vital signs reviewed and stable Respiratory status: spontaneous breathing, nonlabored ventilation and respiratory function stable Cardiovascular status: blood pressure returned to baseline and stable Postop Assessment: no apparent nausea or vomiting Anesthetic complications: no   No notable events documented.  Last Vitals:  Vitals:   04/03/23 2137 04/04/23 0004  BP: 115/78 122/85  Pulse: 99 (!) 101  Resp: 17 17  Temp: 36.4 C 36.9 C  SpO2: 97% 97%    Last Pain:  Vitals:   04/04/23 0004  TempSrc: Oral  PainSc:                  Catheryn Bacon Eiko Mcgowen

## 2023-04-04 NOTE — Plan of Care (Signed)

## 2023-04-04 NOTE — Progress Notes (Signed)
[facility] PROGRESS NOTE   Andrew Knight:[id number] DOB: 1945/09/01 DOA: 04/03/2023  PCP: Juline Patch, MD  Brief History/Interval Summary: 77 y.o. male who who presented to the emergency department after a fall.  Patient was walking in his yard when he lost his balance and fell on his right side.  He suddenly developed acute onset severe right hip pain and inability to stand or walk.  He presented to the ER.  He was found to have right hip fracture.  He was hospitalized for further management.   Consultants: Orthopedics  Procedures: ORIF right hip    Subjective/Interval History: Patient complains of pain when he tries to move his right lower extremity.  Denies any chest pain shortness of breath nausea or vomiting.  He denies any chronic health problems except for hearing impairment for which he uses hearing aids.    Assessment/Plan:  Right hip fracture Status post ORIF.  Pain control.  DVT prophylaxis per orthopedics.  PT and OT evaluation.  Bowel regimen.  Check labs tomorrow.   DVT Prophylaxis: SCDs for now.  Chemoprophylaxis per orthopedics. Code Status: Full code Family Communication: Discussed with patient.  No family at bedside Disposition Plan: To be determined  Status is: Inpatient Remains inpatient appropriate because: Right hip fracture      Medications: Scheduled:  aspirin EC  81 mg Oral Daily   Continuous:   ceFAZolin (ANCEF) IV 1 g (04/04/23 0243)   NFA:OZHYQMVHQIONG **OR** acetaminophen, morphine injection, ondansetron **OR** ondansetron (ZOFRAN) IV, oxyCODONE  Antibiotics: Anti-infectives (From admission, onward)    Start     Dose/Rate Route Frequency Ordered Stop   04/04/23 0600  ceFAZolin (ANCEF) IVPB 2g/100 mL premix        2 g 200 mL/hr over 30 Minutes Intravenous On call to O.R. 04/03/23 1553 04/03/23 1716   04/04/23 0200  ceFAZolin (ANCEF) IVPB 1 g/50 mL premix        1 g 100 mL/hr over 30 Minutes Intravenous Every 8  hours 04/03/23 1840 04/05/23 0559   04/03/23 1654  ceFAZolin (ANCEF) 2-4 GM/100ML-% IVPB  Status:  Discontinued       Note to Pharmacy: Shirleen Schirmer K: cabinet override      04/03/23 1654 04/03/23 1658       Objective:  Vital Signs  Vitals:   04/03/23 1930 04/03/23 2137 04/04/23 0004 04/04/23 0754  BP: 124/75 115/78 122/85 102/87  Pulse: 82 99 (!) 101 95  Resp: 14 17 17 17   Temp:  97.6 F (36.4 C) 98.4 F (36.9 C) 98.4 F (36.9 C)  TempSrc:  Oral Oral Oral  SpO2: 94% 97% 97% 99%  Weight:      Height:        Intake/Output Summary (Last 24 hours) at 04/04/2023 0934 Last data filed at 04/04/2023 0836 Gross per 24 hour  Intake 970 ml  Output 605 ml  Net 365 ml   Filed Weights   04/03/23 1148  Weight: 90.7 kg    General appearance: Awake alert.  In no distress Resp: Clear to auscultation bilaterally.  Normal effort Cardio: S1-S2 is normal regular.  No S3-S4.  No rubs murmurs or bruit GI: Abdomen is soft.  Nontender nondistended.  Bowel sounds are present normal.  No masses organomegaly Extremities: No bruising noted over the right thigh area.   Lab Results:  Data Reviewed: I have personally reviewed following labs and reports of the imaging studies  CBC: Recent Labs  Lab 04/03/23 1308  WBC 10.7*  NEUTROABS 9.1*  HGB 15.4  HCT 44.5  MCV 94.3  PLT 209    Basic Metabolic Panel: Recent Labs  Lab 04/03/23 1308  NA 136  K 3.9  CL 104  CO2 21*  GLUCOSE 135*  BUN 16  CREATININE 0.89  CALCIUM 9.3    GFR: Estimated Creatinine Clearance: 78.9 mL/min (by C-G formula based on SCr of 0.89 mg/dL).    Recent Results (from the past 240 hour(s))  Surgical pcr screen     Status: Abnormal   Collection Time: 04/03/23  3:55 PM   Specimen: Nasal Mucosa; Nasal Swab  Result Value Ref Range Status   MRSA, PCR (A) NEGATIVE Final    INVALID, UNABLE TO DETERMINE THE PRESENCE OF TARGET DUE TO SPECIMEN INTEGRITY. RECOLLECTION REQUESTED.    Comment: RESULT  CALLED TO, READ BACK BY AND VERIFIED WITH: EMAILED L. Yetta Numbers @2059  FH    Staphylococcus aureus (A) NEGATIVE Final    INVALID, UNABLE TO DETERMINE THE PRESENCE OF TARGET DUE TO SPECIMEN INTEGRITY. RECOLLECTION REQUESTED.    Comment: Performed at [facility], [address], [place], [place] [address]      Radiology Studies: DG FEMUR, [place] 2 VIEWS RIGHT  Result Date: 04/03/2023 CLINICAL DATA:  Intramedullary nailing of right femur. EXAM: RIGHT FEMUR 2 VIEWS COMPARISON:  04/11/2023. FINDINGS: Three saved fluoroscopic images were obtained intraoperatively. Total fluoroscopy time is 80.1 seconds. Dose: 18.46 mGy. There is redemonstration of a fracture of the proximal right femur with interval placement of medullary rod and screws. Please see operative report for additional information. IMPRESSION: Intraoperative utilization of fluoroscopy. Electronically Signed   By: Thornell Sartorius M.D.   On: 04/03/2023 20:14   DG C-Arm 1-60 Min-No Report  Result Date: 04/03/2023 Fluoroscopy was utilized by the requesting physician.  No radiographic interpretation.   DG Pelvis Portable  Result Date: 04/03/2023 CLINICAL DATA:  Fall while cleaning leads, hip pain EXAM: PORTABLE PELVIS 1-2 VIEWS; RIGHT FEMUR PORTABLE 1 VIEW COMPARISON:  None Available. FINDINGS: Pelvis: There is an acute comminuted and mildly angulated right subtrochanteric fracture. Femoroacetabular alignment is maintained. There is no other acute fracture. The SI joints and symphysis pubis are intact. The joint spaces are preserved. There is no erosive change Femur: There is no other acute fracture of the right femur. Knee alignment appears maintained. IMPRESSION: Acute angulated right subtrochanteric fracture. Electronically Signed   By: Lesia Hausen M.D.   On: 04/03/2023 14:50   DG FEMUR PORT, 1V RIGHT  Result Date: 04/03/2023 CLINICAL DATA:  Fall while cleaning leads, hip pain EXAM: PORTABLE PELVIS 1-2 VIEWS; RIGHT FEMUR PORTABLE 1  VIEW COMPARISON:  None Available. FINDINGS: Pelvis: There is an acute comminuted and mildly angulated right subtrochanteric fracture. Femoroacetabular alignment is maintained. There is no other acute fracture. The SI joints and symphysis pubis are intact. The joint spaces are preserved. There is no erosive change Femur: There is no other acute fracture of the right femur. Knee alignment appears maintained. IMPRESSION: Acute angulated right subtrochanteric fracture. Electronically Signed   By: Lesia Hausen M.D.   On: 04/03/2023 14:50       LOS: 1 day   [employer]  [facility] Pager on [email or web address]  04/04/2023, 9:34 AM

## 2023-04-05 DIAGNOSIS — D649 Anemia, unspecified: Secondary | ICD-10-CM | POA: Diagnosis not present

## 2023-04-05 DIAGNOSIS — E871 Hypo-osmolality and hyponatremia: Secondary | ICD-10-CM | POA: Diagnosis not present

## 2023-04-05 DIAGNOSIS — S7224XA Nondisplaced subtrochanteric fracture of right femur, initial encounter for closed fracture: Secondary | ICD-10-CM | POA: Diagnosis not present

## 2023-04-05 LAB — CBC
HCT: 33.5 % — ABNORMAL LOW (ref 39.0–52.0)
Hemoglobin: 11.3 g/dL — ABNORMAL LOW (ref 13.0–17.0)
MCH: 32.5 pg (ref 26.0–34.0)
MCHC: 33.7 g/dL (ref 30.0–36.0)
MCV: 96.3 fL (ref 80.0–100.0)
Platelets: 190 10*3/uL (ref 150–400)
RBC: 3.48 MIL/uL — ABNORMAL LOW (ref 4.22–5.81)
RDW: 13.7 % (ref 11.5–15.5)
WBC: 9.8 10*3/uL (ref 4.0–10.5)
nRBC: 0 % (ref 0.0–0.2)

## 2023-04-05 LAB — BASIC METABOLIC PANEL
Anion gap: 10 (ref 5–15)
BUN: 14 mg/dL (ref 8–23)
CO2: 25 mmol/L (ref 22–32)
Calcium: 8.3 mg/dL — ABNORMAL LOW (ref 8.9–10.3)
Chloride: 99 mmol/L (ref 98–111)
Creatinine, Ser: 0.92 mg/dL (ref 0.61–1.24)
GFR, Estimated: >60 mL/min (ref >60)
Glucose, Bld: 119 mg/dL — ABNORMAL HIGH (ref 70–99)
Potassium: 3.5 mmol/L (ref 3.5–5.1)
Sodium: 134 mmol/L — ABNORMAL LOW (ref 135–145)

## 2023-04-05 LAB — MAGNESIUM: Magnesium: 2 mg/dL (ref 1.7–2.4)

## 2023-04-05 NOTE — Progress Notes (Signed)
[facility] PROGRESS NOTE   Andrew Knight ZOX:[id number] DOB: September 13, 1945 DOA: 04/03/2023  PCP: Juline Patch, MD  Brief History/Interval Summary: 77 y.o. male who who presented to the emergency department after a fall.  Patient was walking in his yard when he lost his balance and fell on his right side.  He suddenly developed acute onset severe right hip pain and inability to stand or walk.  He presented to the ER.  He was found to have right hip fracture.  He was hospitalized for further management.   Consultants: Orthopedics  Procedures: ORIF right hip    Subjective/Interval History: No new complaints.  Pain in the right leg appears to be improving.    Assessment/Plan:  Right hip fracture Status post ORIF.  Pain control.  On aspirin for DVT prophylaxis.  Seen by PT and OT.  SNF is recommended for short-term rehab.  Patient is agreeable.  TOC consult placed.   Normocytic anemia Drop in hemoglobin is likely due to combination of fracture, operative loss as well as dilution.  Recheck labs tomorrow.  Mild hyponatremia Recheck labs tomorrow.   DVT Prophylaxis: Aspirin twice daily per orthopedics Code Status: Full code Family Communication: Discussed with patient.  No family at bedside Disposition Plan: SNF    Medications: Scheduled:  aspirin EC  81 mg Oral BID   Continuous:   WJX:BJYNWGNFAOZHY **OR** acetaminophen, morphine injection, ondansetron **OR** ondansetron (ZOFRAN) IV, oxyCODONE  Antibiotics: Anti-infectives (From admission, onward)    Start     Dose/Rate Route Frequency Ordered Stop   04/04/23 0600  ceFAZolin (ANCEF) IVPB 2g/100 mL premix        2 g 200 mL/hr over 30 Minutes Intravenous On call to O.R. 04/03/23 1553 04/03/23 1716   04/04/23 0200  ceFAZolin (ANCEF) IVPB 1 g/50 mL premix        1 g 100 mL/hr over 30 Minutes Intravenous Every 8 hours 04/03/23 1840 04/04/23 2240   04/03/23 1654  ceFAZolin (ANCEF) 2-4 GM/100ML-% IVPB  Status:   Discontinued       Note to Pharmacy: Shirleen Schirmer K: cabinet override      04/03/23 1654 04/03/23 1658       Objective:  Vital Signs  Vitals:   04/04/23 1356 04/04/23 2017 04/05/23 0524 04/05/23 0821  BP: 109/63 125/79 (!) 114/50 119/73  Pulse: 85 (!) 102 76 89  Resp: 18  18 18   Temp: 98.9 F (37.2 C) 98.4 F (36.9 C) 97.8 F (36.6 C) 98.3 F (36.8 C)  TempSrc: Oral Oral Oral Oral  SpO2:  96% 95% 97%  Weight:      Height:        Intake/Output Summary (Last 24 hours) at 04/05/2023 0928 Last data filed at 04/05/2023 8657 Gross per 24 hour  Intake --  Output 650 ml  Net -650 ml   Filed Weights   04/03/23 1148  Weight: 90.7 kg    General appearance: Awake alert.  In no distress Resp: Clear to auscultation bilaterally.  Normal effort Cardio: S1-S2 is normal regular.  No S3-S4.  No rubs murmurs or bruit GI: Abdomen is soft.  Nontender nondistended.  Bowel sounds are present normal.  No masses organomegaly No bruising noted over the right thigh area   Lab Results:  Data Reviewed: I have personally reviewed following labs and reports of the imaging studies  CBC: Recent Labs  Lab 04/03/23 1308 04/05/23 0350  WBC 10.7* 9.8  NEUTROABS 9.1*  --   HGB 15.4 11.3*  HCT 44.5 33.5*  MCV 94.3 96.3  PLT 209 190    Basic Metabolic Panel: Recent Labs  Lab 04/03/23 1308 04/05/23 0350  NA 136 134*  K 3.9 3.5  CL 104 99  CO2 21* 25  GLUCOSE 135* 119*  BUN 16 14  CREATININE 0.89 0.92  CALCIUM 9.3 8.3*  MG  --  2.0    GFR: Estimated Creatinine Clearance: 76.3 mL/min (by C-G formula based on SCr of 0.92 mg/dL).    Recent Results (from the past 240 hour(s))  Surgical pcr screen     Status: Abnormal   Collection Time: 04/03/23  3:55 PM   Specimen: Nasal Mucosa; Nasal Swab  Result Value Ref Range Status   MRSA, PCR (A) NEGATIVE Final    INVALID, UNABLE TO DETERMINE THE PRESENCE OF TARGET DUE TO SPECIMEN INTEGRITY. RECOLLECTION REQUESTED.    Comment:  RESULT CALLED TO, READ BACK BY AND VERIFIED WITH: EMAILED L. Yetta Numbers @2059  FH    Staphylococcus aureus (A) NEGATIVE Final    INVALID, UNABLE TO DETERMINE THE PRESENCE OF TARGET DUE TO SPECIMEN INTEGRITY. RECOLLECTION REQUESTED.    Comment: Performed at [facility], [address], [place], [place] [address]      Radiology Studies: DG FEMUR, [place] 2 VIEWS RIGHT  Result Date: 04/03/2023 CLINICAL DATA:  Intramedullary nailing of right femur. EXAM: RIGHT FEMUR 2 VIEWS COMPARISON:  04/11/2023. FINDINGS: Three saved fluoroscopic images were obtained intraoperatively. Total fluoroscopy time is 80.1 seconds. Dose: 18.46 mGy. There is redemonstration of a fracture of the proximal right femur with interval placement of medullary rod and screws. Please see operative report for additional information. IMPRESSION: Intraoperative utilization of fluoroscopy. Electronically Signed   By: Thornell Sartorius M.D.   On: 04/03/2023 20:14   DG C-Arm 1-60 Min-No Report  Result Date: 04/03/2023 Fluoroscopy was utilized by the requesting physician.  No radiographic interpretation.   DG Pelvis Portable  Result Date: 04/03/2023 CLINICAL DATA:  Fall while cleaning leads, hip pain EXAM: PORTABLE PELVIS 1-2 VIEWS; RIGHT FEMUR PORTABLE 1 VIEW COMPARISON:  None Available. FINDINGS: Pelvis: There is an acute comminuted and mildly angulated right subtrochanteric fracture. Femoroacetabular alignment is maintained. There is no other acute fracture. The SI joints and symphysis pubis are intact. The joint spaces are preserved. There is no erosive change Femur: There is no other acute fracture of the right femur. Knee alignment appears maintained. IMPRESSION: Acute angulated right subtrochanteric fracture. Electronically Signed   By: Lesia Hausen M.D.   On: 04/03/2023 14:50   DG FEMUR PORT, 1V RIGHT  Result Date: 04/03/2023 CLINICAL DATA:  Fall while cleaning leads, hip pain EXAM: PORTABLE PELVIS 1-2 VIEWS; RIGHT FEMUR  PORTABLE 1 VIEW COMPARISON:  None Available. FINDINGS: Pelvis: There is an acute comminuted and mildly angulated right subtrochanteric fracture. Femoroacetabular alignment is maintained. There is no other acute fracture. The SI joints and symphysis pubis are intact. The joint spaces are preserved. There is no erosive change Femur: There is no other acute fracture of the right femur. Knee alignment appears maintained. IMPRESSION: Acute angulated right subtrochanteric fracture. Electronically Signed   By: Lesia Hausen M.D.   On: 04/03/2023 14:50       LOS: 2 days   Andrew Knight  [facility] Pager on [email or web address]  04/05/2023, 9:28 AM
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
## 2023-04-05 NOTE — Plan of Care (Signed)
Problem: Activity: Goal: Risk for activity intolerance will decrease Outcome: Progressing   Problem: Nutrition: Goal: Adequate nutrition will be maintained Outcome: Progressing   Problem: Elimination: Goal: Will not experience complications related to bowel motility Outcome: Progressing

## 2023-04-05 NOTE — Progress Notes (Signed)
.  Subjective: 2 Days Post-Op Procedure(s) (LRB): INTRAMEDULLARY (IM) NAILING OF RIGHT FEMUR (Right)  Activity level:  WBAT Diet tolerance:  as tolerated Voiding:  as tolerated Patient reports pain as moderate.    Objective: Vital signs in last 24 hours: Temp:  [97.8 F (36.6 C)-98.9 F (37.2 C)] 98.3 F (36.8 C) (09/14 0821) Pulse Rate:  [76-102] 89 (09/14 0821) Resp:  [18] 18 (09/14 0821) BP: (109-125)/(50-79) 119/73 (09/14 0821) SpO2:  [95 %-97 %] 97 % (09/14 0821)  Labs: Recent Labs    04/03/23 1308 04/05/23 0350  HGB 15.4 11.3*   Recent Labs    04/03/23 1308 04/05/23 0350  WBC 10.7* 9.8  RBC 4.72 3.48*  HCT 44.5 33.5*  PLT 209 190   Recent Labs    04/03/23 1308 04/05/23 0350  NA 136 134*  K 3.9 3.5  CL 104 99  CO2 21* 25  BUN 16 14  CREATININE 0.89 0.92  GLUCOSE 135* 119*  CALCIUM 9.3 8.3*   No results for input(s): "LABPT", "INR" in the last 72 hours.  Physical Exam:  Neurologically intact ABD soft Neurovascular intact Sensation intact distally Intact pulses distally Dorsiflexion/Plantar flexion intact Incision: dressing C/D/I  Assessment/Plan:  2 Days Post-Op Procedure(s) (LRB): INTRAMEDULLARY (IM) NAILING OF RIGHT FEMUR (Right) Hgb down to 11.3, likely due to acute blood loss from surgery/fracture-continue to trend WBAT Rehab/PT/OT DVT ppx per primary, recommend asa 81mg  BID for 4 weeks post op Rest of management per medical team      Luci Bank 04/05/2023, 11:52 AM

## 2023-04-05 NOTE — Progress Notes (Signed)
Mobility Specialist: Progress Note   04/05/23 1543  Mobility  Activity Ambulated with assistance in room  Level of Assistance Moderate assist, patient does 50-74%  Assistive Device Front wheel walker  Distance Ambulated (ft) 3 ft  RLE Weight Bearing WBAT  Activity Response Tolerated well  Mobility Referral Yes  $Mobility charge 1 Mobility  Mobility Specialist Start Time (ACUTE ONLY) [id number]  Mobility Specialist Stop Time (ACUTE ONLY) 1009  Mobility Specialist Time Calculation (min) (ACUTE ONLY) 18 min    Pt was agreeable to mobility session - received in bed. MinA for bed mobility to assist BLEs, modA for STS, minA for ambulation. Stated his dizziness was better than it has been; did feel slightly lightheaded during session but tolerable. Had c/o RLE rated 9/10 but still agreeable to attempt ambulation. Took a few steps towards chair but unable to step backwards at that time. Left in chair.   Pt requested MS return around 3pm to transfer him back to bed. ModA for STS, minA for ambulation. Transferred to bed and took a couple backwards step before sitting down. Also completed ~20 small lateral scoots to adjust himself in bed. MinA for bed mobility again to assist BLEs. Left in bed with all needs met, call bell in reach.   Maurene Capes Mobility Specialist Please contact via SecureChat or Rehab office at [phone]
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
## 2023-04-05 NOTE — Plan of Care (Signed)

## 2023-04-05 NOTE — Progress Notes (Signed)
Physical Therapy Treatment Patient Details Name: Andrew Knight MRN: [id number] DOB: April 28, 1946 Today's Date: 04/05/2023   History of Present Illness 77 y.o. male who who presents emergency department after a fall.  PMH includes: R knee scope, L RCR, R RCT, vertigo.    PT Comments  Pt received sitting in the recliner and agreeable to session. Pt able to perform seated RLE exercises with limited hip flexion due to weakness and pain. Pt able to stand x2 with min A and increased time due to pain. Pt able to tolerate weight shifting and marching, however is unable to clear LLE from floor due to limited RLE WB tolerance. Pt requires a seated rest break due to pain and fatigue. Pt continues to benefit from PT services to progress toward functional mobility goals.    If plan is discharge home, recommend the following: A lot of help with walking and/or transfers;A lot of help with bathing/dressing/bathroom;Assistance with cooking/housework;Assist for transportation;Help with stairs or ramp for entrance   Can travel by private vehicle     No  Equipment Recommendations  Rolling walker (2 wheels);BSC/3in1    Recommendations for Other Services       Precautions / Restrictions Precautions Precautions: Fall Precaution Comments: Watch BP Restrictions Weight Bearing Restrictions: Yes RLE Weight Bearing: Weight bearing as tolerated     Mobility  Bed Mobility               General bed mobility comments: Pt beginning and ending session in recliner    Transfers Overall transfer level: Needs assistance Equipment used: Rolling walker (2 wheels) Transfers: Sit to/from Stand Sit to Stand: Min assist           General transfer comment: from recliner x2 with increased time and min A for power up. Cues for hand placement.    Ambulation/Gait             Pre-gait activities: weight shifting and marching General Gait Details: unable due to pain      Balance Overall balance  assessment: Needs assistance Sitting-balance support: Feet supported, Bilateral upper extremity supported Sitting balance-Leahy Scale: Fair Sitting balance - Comments: sitting in recliner   Standing balance support: Bilateral upper extremity supported, Reliant on assistive device for balance Standing balance-Leahy Scale: Poor Standing balance comment: with RW support                            Cognition Arousal: Alert Behavior During Therapy: WFL for tasks assessed/performed Overall Cognitive Status: Within Functional Limits for tasks assessed                                          Exercises General Exercises - Lower Extremity Long Arc Quad: AROM, Seated, Right, 10 reps Hip Flexion/Marching: AROM, Seated, Right, 5 reps    General Comments        Pertinent Vitals/Pain Pain Assessment Pain Assessment: Faces Faces Pain Scale: Hurts whole lot Pain Location: RLE Pain Descriptors / Indicators: Grimacing, Moaning, Operative site guarding Pain Intervention(s): Limited activity within patient's tolerance, Monitored during session, Repositioned     PT Goals (current goals can now be found in the care plan section) Acute Rehab PT Goals Patient Stated Goal: to return to independence PT Goal Formulation: With patient Time For Goal Achievement: 04/18/23 Progress towards PT goals: Progressing toward goals  Frequency    Min 1X/week       AM-PAC PT "6 Clicks" Mobility   Outcome Measure  Help needed turning from your back to your side while in a flat bed without using bedrails?: A Little Help needed moving from lying on your back to sitting on the side of a flat bed without using bedrails?: A Little Help needed moving to and from a bed to a chair (including a wheelchair)?: A Little Help needed standing up from a chair using your arms (e.g., wheelchair or bedside chair)?: A Little Help needed to walk in hospital room?: Total Help needed climbing  3-5 steps with a railing? : Total 6 Click Score: 14    End of Session Equipment Utilized During Treatment: Gait belt Activity Tolerance: Patient limited by pain Patient left: with call bell/phone within reach;with family/visitor present;in chair Nurse Communication: Mobility status PT Visit Diagnosis: Other abnormalities of gait and mobility (R26.89);Muscle weakness (generalized) (M62.81)     Time: 1610-9604 PT Time Calculation (min) (ACUTE ONLY): 26 min  Charges:    $Therapeutic Exercise: 8-22 mins $Therapeutic Activity: 8-22 mins PT General Charges $$ ACUTE PT VISIT: 1 Visit                    Johny Shock, PTA Acute Rehabilitation Services Secure Chat Preferred  [phone]    Johny Shock 04/05/2023, 12:59 PM

## 2023-04-05 NOTE — TOC CAGE-AID Note (Signed)
Transition of Care [facility]) - CAGE-AID Screening   Patient Details  Name: Andrew Knight MRN: [id number] Date of Birth: 25-May-1946  Transition of Care [facility]) CM/SW Contact:    Leota Sauers, RN Phone Number: 04/05/2023, 4:21 AM   Clinical Narrative:  Patient endorses some alcohol use, denies illicit drug use. Resources not given at this time.  CAGE-AID Screening:    Have You Ever Felt You Ought to Cut Down on Your Drinking or Drug Use?: No Have People Annoyed You By Critizing Your Drinking Or Drug Use?: No Have You Felt Bad Or Guilty About Your Drinking Or Drug Use?: No Have You Ever Had a Drink or Used Drugs First Thing In The Morning to Steady Your Nerves or to Get Rid of a Hangover?: No CAGE-AID Score: 0  Substance Abuse Education Offered: No

## 2023-04-06 DIAGNOSIS — E871 Hypo-osmolality and hyponatremia: Secondary | ICD-10-CM | POA: Diagnosis not present

## 2023-04-06 DIAGNOSIS — S7224XA Nondisplaced subtrochanteric fracture of right femur, initial encounter for closed fracture: Secondary | ICD-10-CM | POA: Diagnosis not present

## 2023-04-06 DIAGNOSIS — D649 Anemia, unspecified: Secondary | ICD-10-CM | POA: Diagnosis not present

## 2023-04-06 LAB — BASIC METABOLIC PANEL
Anion gap: 8 (ref 5–15)
BUN: 11 mg/dL (ref 8–23)
CO2: 27 mmol/L (ref 22–32)
Calcium: 8.5 mg/dL — ABNORMAL LOW (ref 8.9–10.3)
Chloride: 99 mmol/L (ref 98–111)
Creatinine, Ser: 0.81 mg/dL (ref 0.61–1.24)
GFR, Estimated: >60 mL/min (ref >60)
Glucose, Bld: 107 mg/dL — ABNORMAL HIGH (ref 70–99)
Potassium: 3.6 mmol/L (ref 3.5–5.1)
Sodium: 134 mmol/L — ABNORMAL LOW (ref 135–145)

## 2023-04-06 LAB — CBC
HCT: 32.6 % — ABNORMAL LOW (ref 39.0–52.0)
Hemoglobin: 11.2 g/dL — ABNORMAL LOW (ref 13.0–17.0)
MCH: 33.5 pg (ref 26.0–34.0)
MCHC: 34.4 g/dL (ref 30.0–36.0)
MCV: 97.6 fL (ref 80.0–100.0)
Platelets: 180 10*3/uL (ref 150–400)
RBC: 3.34 MIL/uL — ABNORMAL LOW (ref 4.22–5.81)
RDW: 13.3 % (ref 11.5–15.5)
WBC: 10.2 10*3/uL (ref 4.0–10.5)
nRBC: 0 % (ref 0.0–0.2)

## 2023-04-06 LAB — MAGNESIUM: Magnesium: 2.2 mg/dL (ref 1.7–2.4)

## 2023-04-06 MED ORDER — SENNOSIDES-DOCUSATE SODIUM 8.6-50 MG PO TABS
2.0000 | ORAL_TABLET | Freq: Two times a day (BID) | ORAL | Status: DC
  Administered 2023-04-06 – 2023-04-07 (×3): 2 via ORAL
  Filled 2023-04-06 (×3): qty 2

## 2023-04-06 MED ORDER — POLYETHYLENE GLYCOL 3350 17 G PO PACK
17.0000 g | PACK | Freq: Every day | ORAL | Status: DC | PRN
  Administered 2023-04-06 – 2023-04-07 (×2): 17 g via ORAL
  Filled 2023-04-06 (×2): qty 1

## 2023-04-06 NOTE — Plan of Care (Signed)

## 2023-04-06 NOTE — Progress Notes (Signed)
Mobility Specialist: Progress Note   04/06/23 1619  Mobility  Activity Ambulated with assistance in room  Level of Assistance Minimal assist, patient does 75% or more  Assistive Device Front wheel walker  Distance Ambulated (ft) 10 ft  RLE Weight Bearing WBAT  Activity Response Tolerated well  Mobility Referral Yes  $Mobility charge 1 Mobility  Mobility Specialist Start Time (ACUTE ONLY) 1104  Mobility Specialist Stop Time (ACUTE ONLY) 1115  Mobility Specialist Time Calculation (min) (ACUTE ONLY) 11 min    Pt was agreeable to mobility session - received in chair. ModA for STS, minA for ambulation d/t intense RLE pain and weakness. Took 2x standing break (~10 secs) d/t fatigue. Returned to chair without fault. Left in chair with all needs met, call bell in reach. Wife in room.   MS returned around 2pm to check if pt was ready to return to bed. Pt requested to be transferred from C>B. ModA for STS, minA for transfer and bed mobility to assist BLEs. Had c/o RLE pain same as earlier. Left in bed with all needs met, call bell in reach.    Maurene Capes Mobility Specialist Please contact via SecureChat or Rehab office at [phone]

## 2023-04-06 NOTE — Progress Notes (Signed)
Physical Therapy Treatment Patient Details Name: CEDERIC TUMULTY MRN: [id number] DOB: 10-13-1945 Today's Date: 04/06/2023   History of Present Illness 77 y.o. male presents to [facility] hospital on 04/03/2023 after a fall. Pt found to have R intertrochanteric hip fx, underwent IM nailing on 9/12. PMH includes vertigo.    PT Comments  The pt was able to make great progress with ambulation tolerance this session, completing 35 ft with chair follow for safety. No overt buckling or LOB, but pt remains with significant limitation in strength and mobility in RLE due to pain, very poor wt acceptance on RLE and hip flexion for stepping at this time. Pt encouraged in LE movement to complete outside of therapy sessions and he expressed understanding. Recommendations remain appropriate.     If plan is discharge home, recommend the following: A lot of help with walking and/or transfers;A lot of help with bathing/dressing/bathroom;Assistance with cooking/housework;Assist for transportation;Help with stairs or ramp for entrance   Can travel by private vehicle     No  Equipment Recommendations  Rolling walker (2 wheels);BSC/3in1    Recommendations for Other Services       Precautions / Restrictions Precautions Precautions: Fall Precaution Comments: Watch BP Restrictions Weight Bearing Restrictions: Yes RLE Weight Bearing: Weight bearing as tolerated     Mobility  Bed Mobility Overal bed mobility: Needs Assistance Bed Mobility: Supine to Sit     Supine to sit: Min assist, HOB elevated, Used rails     General bed mobility comments: significantly increased time for bed mobility, verbal and tactile cues for sequencing    Transfers Overall transfer level: Needs assistance Equipment used: Rolling walker (2 wheels) Transfers: Sit to/from Stand Sit to Stand: Min assist   Step pivot transfers: Min assist       General transfer comment: completed sit-stand from EOB with increased time, cues for  UE placement. able to rise from recliner with increased time and cues as well    Ambulation/Gait Ambulation/Gait assistance: Contact guard assist Gait Distance (Feet): 35 Feet Assistive device: Rolling walker (2 wheels) Gait Pattern/deviations: Step-to pattern, Decreased step length - right, Decreased step length - left, Decreased weight shift to right Gait velocity: decreased Gait velocity interpretation: <1.31 ft/sec, indicative of household ambulator   General Gait Details: poor wt acceptance on RLE and poor hip flexion of RLE. cues for RW movement and step-through pattern     Balance Overall balance assessment: Needs assistance Sitting-balance support: No upper extremity supported, Feet supported Sitting balance-Leahy Scale: Fair     Standing balance support: Bilateral upper extremity supported, Reliant on assistive device for balance Standing balance-Leahy Scale: Poor Standing balance comment: dependent on BUE support                            Cognition Arousal: Alert Behavior During Therapy: WFL for tasks assessed/performed Overall Cognitive Status: Within Functional Limits for tasks assessed                                          Exercises General Exercises - Lower Extremity Ankle Circles/Pumps: AROM, Both, 10 reps Long Arc Quad: AAROM, Right, 10 reps, Seated Heel Raises: AROM, Both, 20 reps, Seated    General Comments General comments (skin integrity, edema, etc.): VSS on RA      Pertinent Vitals/Pain Pain Assessment Pain Assessment: Faces Faces Pain  Scale: Hurts even more Pain Location: RLE Pain Descriptors / Indicators: Grimacing Pain Intervention(s): Limited activity within patient's tolerance, Monitored during session, Repositioned           PT Goals (current goals can now be found in the care plan section) Acute Rehab PT Goals Patient Stated Goal: to return to independence PT Goal Formulation: With patient Time For  Goal Achievement: 04/18/23 Potential to Achieve Goals: Good Progress towards PT goals: Progressing toward goals    Frequency    Min 1X/week       AM-PAC PT "6 Clicks" Mobility   Outcome Measure  Help needed turning from your back to your side while in a flat bed without using bedrails?: A Little Help needed moving from lying on your back to sitting on the side of a flat bed without using bedrails?: A Little Help needed moving to and from a bed to a chair (including a wheelchair)?: A Little Help needed standing up from a chair using your arms (e.g., wheelchair or bedside chair)?: A Little Help needed to walk in hospital room?: A Little Help needed climbing 3-5 steps with a railing? : Total 6 Click Score: 16    End of Session Equipment Utilized During Treatment: Gait belt Activity Tolerance: Patient tolerated treatment well Patient left: with call bell/phone within reach;in chair;with family/visitor present Nurse Communication: Mobility status PT Visit Diagnosis: Other abnormalities of gait and mobility (R26.89);Muscle weakness (generalized) (M62.81)     Time: 1610-9604 PT Time Calculation (min) (ACUTE ONLY): 29 min  Charges:    $Gait Training: 8-22 mins $Therapeutic Exercise: 8-22 mins PT General Charges $$ ACUTE PT VISIT: 1 Visit                     Vickki Muff, PT, DPT   Acute Rehabilitation Department Office [phone] Secure Chat Communication Preferred   Ronnie Derby 04/06/2023, 5:38 PM

## 2023-04-06 NOTE — Progress Notes (Signed)
[facility] PROGRESS NOTE   Andrew Knight HQI:[id number] DOB: 1946-06-04 DOA: 04/03/2023  PCP: Juline Patch, MD  Brief History/Interval Summary: 77 y.o. male who who presented to the emergency department after a fall.  Patient was walking in his yard when he lost his balance and fell on his right side.  He suddenly developed acute onset severe right hip pain and inability to stand or walk.  He presented to the ER.  He was found to have right hip fracture.  He was hospitalized for further management.   Consultants: Orthopedics  Procedures: ORIF right hip    Subjective/Interval History: Pain is getting better.  Denies any new complaints.  No chest pain shortness of breath.  Has been 3 days since he has had a bowel movement.  Assessment/Plan:  Right hip fracture Status post ORIF.  Pain control.  On aspirin for DVT prophylaxis.  Seen by PT and OT.  SNF is recommended for short-term rehab.  Patient is agreeable.  TOC is following. Initiate bowel regimen.  Normocytic anemia Drop in hemoglobin is likely due to combination of fracture, operative loss as well as dilution.  Stable this morning.  Mild hyponatremia Stable   DVT Prophylaxis: Aspirin twice daily per orthopedics Code Status: Full code Family Communication: Discussed with patient.  No family at bedside Disposition Plan: SNF    Medications: Scheduled:  aspirin EC  81 mg Oral BID   Continuous:   XLK:[id number] **OR** acetaminophen, morphine injection, ondansetron **OR** ondansetron (ZOFRAN) IV, oxyCODONE  Antibiotics: Anti-infectives (From admission, onward)    Start     Dose/Rate Route Frequency Ordered Stop   04/04/23 0600  ceFAZolin (ANCEF) IVPB 2g/100 mL premix        2 g 200 mL/hr over 30 Minutes Intravenous On call to O.R. 04/03/23 1553 04/03/23 1716   04/04/23 0200  ceFAZolin (ANCEF) IVPB 1 g/50 mL premix        1 g 100 mL/hr over 30 Minutes Intravenous Every 8 hours 04/03/23 1840  04/04/23 2240   04/03/23 1654  ceFAZolin (ANCEF) 2-4 GM/100ML-% IVPB  Status:  Discontinued       Note to Pharmacy: Shirleen Schirmer K: cabinet override      04/03/23 1654 04/03/23 1658       Objective:  Vital Signs  Vitals:   04/05/23 1152 04/05/23 2046 04/06/23 0506 04/06/23 0832  BP: 116/62 119/62 124/65 128/72  Pulse: 90 92 76 86  Resp: 20 18 17 18   Temp: 98.2 F (36.8 C) 98.3 F (36.8 C)  98 F (36.7 C)  TempSrc: Oral     SpO2: 98% 98% 97% 98%  Weight:      Height:        Intake/Output Summary (Last 24 hours) at 04/06/2023 0907 Last data filed at 04/06/2023 0602 Gross per 24 hour  Intake 480 ml  Output 450 ml  Net 30 ml   Filed Weights   04/03/23 1148  Weight: 90.7 kg    General appearance: Awake alert.  In no distress Resp: Clear to auscultation bilaterally.  Normal effort Cardio: S1-S2 is normal regular.  No S3-S4.  No rubs murmurs or bruit GI: Abdomen is soft.  Nontender nondistended.  Bowel sounds are present normal.  No masses organomegaly   Lab Results:  Data Reviewed: I have personally reviewed following labs and reports of the imaging studies  CBC: Recent Labs  Lab 04/03/23 1308 04/05/23 0350 04/06/23 0347  WBC 10.7* 9.8 10.2  NEUTROABS 9.1*  --   --  HGB 15.4 11.3* 11.2*  HCT 44.5 33.5* 32.6*  MCV 94.3 96.3 97.6  PLT 209 190 180    Basic Metabolic Panel: Recent Labs  Lab 04/03/23 1308 04/05/23 0350 04/06/23 0347  NA 136 134* 134*  K 3.9 3.5 3.6  CL 104 99 99  CO2 21* 25 27  GLUCOSE 135* 119* 107*  BUN 16 14 11   CREATININE 0.89 0.92 0.81  CALCIUM 9.3 8.3* 8.5*  MG  --  2.0 2.2    GFR: Estimated Creatinine Clearance: 86.6 mL/min (by C-G formula based on SCr of 0.81 mg/dL).    Recent Results (from the past 240 hour(s))  Surgical pcr screen     Status: Abnormal   Collection Time: 04/03/23  3:55 PM   Specimen: Nasal Mucosa; Nasal Swab  Result Value Ref Range Status   MRSA, PCR (A) NEGATIVE Final    INVALID, UNABLE TO  DETERMINE THE PRESENCE OF TARGET DUE TO SPECIMEN INTEGRITY. RECOLLECTION REQUESTED.    Comment: RESULT CALLED TO, READ BACK BY AND VERIFIED WITH: EMAILED L. Larose Hires [id number] @2059  FH    Staphylococcus aureus (A) NEGATIVE Final    INVALID, UNABLE TO DETERMINE THE PRESENCE OF TARGET DUE TO SPECIMEN INTEGRITY. RECOLLECTION REQUESTED.    Comment: Performed at [facility], [address] [place], [place] [address]      Radiology Studies: No results found.     LOS: 3 days   Yomara Toothman [employer] on [email or web address]  04/06/2023, 9:07 AM

## 2023-04-07 DIAGNOSIS — R1312 Dysphagia, oropharyngeal phase: Secondary | ICD-10-CM | POA: Diagnosis not present

## 2023-04-07 DIAGNOSIS — Z9181 History of falling: Secondary | ICD-10-CM | POA: Diagnosis not present

## 2023-04-07 DIAGNOSIS — S7221XD Displaced subtrochanteric fracture of right femur, subsequent encounter for closed fracture with routine healing: Secondary | ICD-10-CM | POA: Diagnosis not present

## 2023-04-07 DIAGNOSIS — Z743 Need for continuous supervision: Secondary | ICD-10-CM | POA: Diagnosis not present

## 2023-04-07 DIAGNOSIS — R2681 Unsteadiness on feet: Secondary | ICD-10-CM | POA: Diagnosis not present

## 2023-04-07 DIAGNOSIS — I2510 Atherosclerotic heart disease of native coronary artery without angina pectoris: Secondary | ICD-10-CM | POA: Diagnosis not present

## 2023-04-07 DIAGNOSIS — S7222XA Displaced subtrochanteric fracture of left femur, initial encounter for closed fracture: Secondary | ICD-10-CM | POA: Diagnosis not present

## 2023-04-07 DIAGNOSIS — I10 Essential (primary) hypertension: Secondary | ICD-10-CM | POA: Diagnosis not present

## 2023-04-07 DIAGNOSIS — E871 Hypo-osmolality and hyponatremia: Secondary | ICD-10-CM | POA: Diagnosis not present

## 2023-04-07 DIAGNOSIS — S72141A Displaced intertrochanteric fracture of right femur, initial encounter for closed fracture: Secondary | ICD-10-CM | POA: Diagnosis not present

## 2023-04-07 DIAGNOSIS — D649 Anemia, unspecified: Secondary | ICD-10-CM | POA: Diagnosis not present

## 2023-04-07 DIAGNOSIS — S79929A Unspecified injury of unspecified thigh, initial encounter: Secondary | ICD-10-CM | POA: Diagnosis not present

## 2023-04-07 DIAGNOSIS — S7224XA Nondisplaced subtrochanteric fracture of right femur, initial encounter for closed fracture: Secondary | ICD-10-CM | POA: Diagnosis not present

## 2023-04-07 DIAGNOSIS — R2689 Other abnormalities of gait and mobility: Secondary | ICD-10-CM | POA: Diagnosis not present

## 2023-04-07 DIAGNOSIS — M6281 Muscle weakness (generalized): Secondary | ICD-10-CM | POA: Diagnosis not present

## 2023-04-07 MED ORDER — OXYCODONE HCL 5 MG PO TABS: 5.0000 mg | ORAL_TABLET | Freq: Four times a day (QID) | ORAL | 0 refills | Status: DC | PRN

## 2023-04-07 MED ORDER — BISACODYL 10 MG RE SUPP
10.0000 mg | Freq: Once | RECTAL | Status: DC
  Filled 2023-04-07: qty 1

## 2023-04-07 MED ORDER — ASPIRIN 81 MG PO TBEC: DELAYED_RELEASE_TABLET | ORAL | Status: AC

## 2023-04-07 MED ORDER — SENNOSIDES-DOCUSATE SODIUM 8.6-50 MG PO TABS: 2.0000 | ORAL_TABLET | Freq: Two times a day (BID) | ORAL | Status: AC

## 2023-04-07 MED ORDER — POLYETHYLENE GLYCOL 3350 17 G PO PACK: 17.0000 g | PACK | Freq: Every day | ORAL | Status: AC | PRN

## 2023-04-07 MED ORDER — OXYCODONE HCL 5 MG PO TABS: 5.0000 mg | ORAL_TABLET | Freq: Four times a day (QID) | ORAL | 0 refills | Status: AC | PRN

## 2023-04-07 NOTE — Discharge Summary (Signed)
[facility]  Physician Discharge Summary   Patient ID: Andrew Knight MRN: [id number] DOB/AGE: [birth year] 77 y.o.  Admit date: 04/03/2023 Discharge date:   04/07/2023   PCP: Juline Patch, MD  DISCHARGE DIAGNOSES:    Closed displaced subtrochanteric fracture of right femur (HCC)   RECOMMENDATIONS FOR OUTPATIENT FOLLOW UP: Check CBC and basic metabolic panel in 1 week Patient needs to follow-up with orthopedic surgeon Dr. Hulda Humphrey in 2 weeks   Home Health: SNF Equipment/Devices: None  CODE STATUS: Full code  DISCHARGE CONDITION: fair  Diet recommendation: Regular as tolerated  INITIAL HISTORY: 77 y.o. male who who presented to the emergency department after a fall.  Patient was walking in his yard when he lost his balance and fell on his right side.  He suddenly developed acute onset severe right hip pain and inability to stand or walk.  He presented to the ER.  He was found to have right hip fracture.  He was hospitalized for further management.    Consultants: Orthopedics   Procedures: ORIF right hip   HOSPITAL COURSE:   Right hip fracture Status post ORIF.  Pain is reasonably well-controlled.  Bowel regimen.  SNF is recommended for rehab.  TOC is following.     Normocytic anemia Drop in hemoglobin is likely due to combination of fracture, operative loss as well as dilution.  Stable subsequently.  Recheck in a few days at skilled nursing facility.   Mild hyponatremia Stable  Patient is stable.  Okay for discharge to SNF when bed is available.   PERTINENT LABS:  The results of significant diagnostics from this hospitalization (including imaging, microbiology, ancillary and laboratory) are listed below for reference.    Microbiology: Recent Results (from the past 240 hour(s))  Surgical pcr screen     Status: Abnormal   Collection Time: 04/03/23  3:55 PM   Specimen: Nasal Mucosa; Nasal Swab  Result Value Ref Range Status   MRSA, PCR (A) NEGATIVE  Final    INVALID, UNABLE TO DETERMINE THE PRESENCE OF TARGET DUE TO SPECIMEN INTEGRITY. RECOLLECTION REQUESTED.    Comment: RESULT CALLED TO, READ BACK BY AND VERIFIED WITH: EMAILED L. Yetta Numbers @2059  FH    Staphylococcus aureus (A) NEGATIVE Final    INVALID, UNABLE TO DETERMINE THE PRESENCE OF TARGET DUE TO SPECIMEN INTEGRITY. RECOLLECTION REQUESTED.    Comment: Performed at [facility], [address], [place], [place] [address]     Labs:   Basic Metabolic Panel: Recent Labs  Lab 04/03/23 1308 04/05/23 0350 04/06/23 0347  NA 136 134* 134*  K 3.9 3.5 3.6  CL 104 99 99  CO2 21* 25 27  GLUCOSE 135* 119* 107*  BUN 16 14 11   CREATININE 0.89 0.92 0.81  CALCIUM 9.3 8.3* 8.5*  MG  --  2.0 2.2    CBC: Recent Labs  Lab 04/03/23 1308 04/05/23 0350 04/06/23 0347  WBC 10.7* 9.8 10.2  NEUTROABS 9.1*  --   --   HGB 15.4 11.3* 11.2*  HCT 44.5 33.5* 32.6*  MCV 94.3 96.3 97.6  PLT 209 190 180      IMAGING STUDIES DG FEMUR, MIN 2 VIEWS RIGHT  Result Date: 04/03/2023 CLINICAL DATA:  Intramedullary nailing of right femur. EXAM: RIGHT FEMUR 2 VIEWS COMPARISON:  04/11/2023. FINDINGS: Three saved fluoroscopic images were obtained intraoperatively. Total fluoroscopy time is 80.1 seconds. Dose: 18.46 mGy. There is redemonstration of a fracture of the proximal right femur with interval placement of medullary rod and  screws. Please see operative report for additional information. IMPRESSION: Intraoperative utilization of fluoroscopy. Electronically Signed   By: Thornell Sartorius M.D.   On: 04/03/2023 20:14   DG C-Arm 1-60 Min-No Report  Result Date: 04/03/2023 Fluoroscopy was utilized by the requesting physician.  No radiographic interpretation.   DG Pelvis Portable  Result Date: 04/03/2023 CLINICAL DATA:  Fall while cleaning leads, hip pain EXAM: PORTABLE PELVIS 1-2 VIEWS; RIGHT FEMUR PORTABLE 1 VIEW COMPARISON:  None Available. FINDINGS: Pelvis: There is an acute comminuted and  mildly angulated right subtrochanteric fracture. Femoroacetabular alignment is maintained. There is no other acute fracture. The SI joints and symphysis pubis are intact. The joint spaces are preserved. There is no erosive change Femur: There is no other acute fracture of the right femur. Knee alignment appears maintained. IMPRESSION: Acute angulated right subtrochanteric fracture. Electronically Signed   By: Lesia Hausen M.D.   On: 04/03/2023 14:50   DG FEMUR PORT, 1V RIGHT  Result Date: 04/03/2023 CLINICAL DATA:  Fall while cleaning leads, hip pain EXAM: PORTABLE PELVIS 1-2 VIEWS; RIGHT FEMUR PORTABLE 1 VIEW COMPARISON:  None Available. FINDINGS: Pelvis: There is an acute comminuted and mildly angulated right subtrochanteric fracture. Femoroacetabular alignment is maintained. There is no other acute fracture. The SI joints and symphysis pubis are intact. The joint spaces are preserved. There is no erosive change Femur: There is no other acute fracture of the right femur. Knee alignment appears maintained. IMPRESSION: Acute angulated right subtrochanteric fracture. Electronically Signed   By: Lesia Hausen M.D.   On: 04/03/2023 14:50    DISCHARGE EXAMINATION: Vitals:   04/06/23 0832 04/06/23 2052 04/07/23 0409 04/07/23 0746  BP: 128/72 124/73 128/71 113/67  Pulse: 86 86 86 69  Resp: 18 18 18 18   Temp: 98 F (36.7 C) 97.9 F (36.6 C) 98.2 F (36.8 C) 98.2 F (36.8 C)  TempSrc:  Oral Oral Oral  SpO2: 98% 98% 97% 99%  Weight:      Height:       General appearance: Awake alert.  In no distress Resp: Clear to auscultation bilaterally.  Normal effort Cardio: S1-S2 is normal regular.  No S3-S4.  No rubs murmurs or bruit GI: Abdomen is soft.  Nontender nondistended.  Bowel sounds are present normal.  No masses organomegaly   DISPOSITION: SNF  Discharge Instructions     Call MD for:  difficulty breathing, headache or visual disturbances   Complete by: As directed    Call MD for:  extreme  fatigue   Complete by: As directed    Call MD for:  persistant dizziness or light-headedness   Complete by: As directed    Call MD for:  persistant nausea and vomiting   Complete by: As directed    Call MD for:  severe uncontrolled pain   Complete by: As directed    Call MD for:  temperature >100.4   Complete by: As directed    Diet general   Complete by: As directed    Discharge instructions   Complete by: As directed    Please follow instructions provided by orthopedics.  Review destructions on the discharge summary.  You were cared for by a hospitalist during your hospital stay. If you have any questions about your discharge medications or the care you received while you were in the hospital after you are discharged, you can call the unit and asked to speak with the hospitalist on call if the hospitalist that took care of you is not available.  Once you are discharged, your primary care physician will handle any further medical issues. Please note that NO REFILLS for any discharge medications will be authorized once you are discharged, as it is imperative that you return to your primary care physician (or establish a relationship with a primary care physician if you do not have one) for your aftercare needs so that they can reassess your need for medications and monitor your lab values. If you do not have a primary care physician, you can call [phone] for a physician referral.   Increase activity slowly   Complete by: As directed           Allergies as of 04/07/2023   No Known Allergies      Medication List     STOP taking these medications    amoxicillin 500 MG capsule Commonly known as: AMOXIL   aspirin 81 MG tablet Replaced by: aspirin EC 81 MG tablet       TAKE these medications    aspirin EC 81 MG tablet Take 1 tablet twice daily for 4 weeks followed by 1 tablet once daily. Replaces: aspirin 81 MG tablet   b complex vitamins capsule Take 1 capsule by mouth  daily.   oxyCODONE 5 MG immediate release tablet Commonly known as: Oxy IR/ROXICODONE Take 1 tablet (5 mg total) by mouth every 6 (six) hours as needed for severe pain.   polyethylene glycol 17 g packet Commonly known as: MIRALAX / GLYCOLAX Take 17 g by mouth daily as needed for moderate constipation.   senna-docusate 8.6-50 MG tablet Commonly known as: Senokot-S Take 2 tablets by mouth 2 (two) times daily.   VITAMIN D PO Take 1 tablet by mouth daily.          Follow-up Information     Juline Patch, MD Follow up.   Specialty: Internal Medicine Contact information: [address], [address] [place] [address] [phone]         Luci Bank, MD. Schedule an appointment as soon as possible for a visit in 2 week(s).   Specialty: Orthopedic Surgery Why: post hospitalization follow up, For wound re-check Contact information: [address] [place] [address] [phone]                 TOTAL DISCHARGE TIME: 35 minutes  Amiel Mccaffrey Rito Ehrlich  [facility] Pager on [email or web address]  04/07/2023, 9:45 AM

## 2023-04-07 NOTE — Progress Notes (Signed)
Physical Therapy Treatment Patient Details Name: Andrew Knight MRN: [id number] DOB: Dec 10, 1945 Today's Date: 04/07/2023   History of Present Illness 77 y.o. male presents to [facility] hospital on 04/03/2023 after a fall. Pt found to have R intertrochanteric hip fx, underwent IM nailing on 9/12. PMH includes vertigo.    PT Comments  Went to check on pt earlier and eating.  PT returned later and pt with d/c orders and reports PTAR on the way but needing to use restroom.  Assisted pt with ambulation to restroom.  Pt able to ambulate 15'x2.  Also worked on STS from chair and lower toilet with min A and cues.  Further treatment limited as PTAR arrived and pt positioned on stretcher.     If plan is discharge home, recommend the following: A lot of help with walking and/or transfers;A lot of help with bathing/dressing/bathroom;Assistance with cooking/housework;Assist for transportation;Help with stairs or ramp for entrance   Can travel by private vehicle     No  Equipment Recommendations  Rolling walker (2 wheels);BSC/3in1    Recommendations for Other Services       Precautions / Restrictions Precautions Precautions: Fall Restrictions Weight Bearing Restrictions: Yes RLE Weight Bearing: Weight bearing as tolerated     Mobility  Bed Mobility Overal bed mobility: Needs Assistance Bed Mobility: Sit to Supine       Sit to supine: Min assist   General bed mobility comments: Received in chair (up with mobility specialist earlier); returned to stretcher with PTAR (required assist for R LE up to bed)    Transfers Overall transfer level: Needs assistance Equipment used: Rolling walker (2 wheels) Transfers: Sit to/from Stand Sit to Stand: Min assist           General transfer comment: Cues for hand placement; increased time to rise.  Pt stood from recliner and toielt.  Assist to control descent to lower toilet.  Performed his ADLs    Ambulation/Gait Ambulation/Gait assistance:  Contact guard assist Gait Distance (Feet): 15 Feet (15'x2) Assistive device: Rolling walker (2 wheels) Gait Pattern/deviations: Step-to pattern, Decreased step length - right, Decreased step length - left, Decreased weight shift to right Gait velocity: decreased     General Gait Details: Cues for RW proximity and trying to advance R LE as able   Stairs             Wheelchair Mobility     Tilt Bed    Modified Rankin (Stroke Patients Only)       Balance Overall balance assessment: Needs assistance Sitting-balance support: No upper extremity supported, Feet supported Sitting balance-Leahy Scale: Good     Standing balance support: Bilateral upper extremity supported, Reliant on assistive device for balance Standing balance-Leahy Scale: Poor Standing balance comment: dependent on BUE support                            Cognition Arousal: Alert Behavior During Therapy: WFL for tasks assessed/performed Overall Cognitive Status: Within Functional Limits for tasks assessed                                          Exercises      General Comments General comments (skin integrity, edema, etc.): VSS      Pertinent Vitals/Pain Pain Assessment Pain Assessment: Faces Faces Pain Scale: Hurts even more Pain Location: RLE Pain  Descriptors / Indicators: Grimacing Pain Intervention(s): Limited activity within patient's tolerance, Monitored during session, Repositioned    Home Living                          Prior Function            PT Goals (current goals can now be found in the care plan section) Progress towards PT goals: Progressing toward goals    Frequency    Min 1X/week      PT Plan      Co-evaluation              AM-PAC PT "6 Clicks" Mobility   Outcome Measure  Help needed turning from your back to your side while in a flat bed without using bedrails?: A Little Help needed moving from lying on your  back to sitting on the side of a flat bed without using bedrails?: A Lot Help needed moving to and from a bed to a chair (including a wheelchair)?: A Little Help needed standing up from a chair using your arms (e.g., wheelchair or bedside chair)?: A Little Help needed to walk in hospital room?: A Lot Help needed climbing 3-5 steps with a railing? : Total 6 Click Score: 14    End of Session Equipment Utilized During Treatment: Gait belt Activity Tolerance: Patient tolerated treatment well Patient left: Other (comment) (on stretcher with PTAR) Nurse Communication: Mobility status PT Visit Diagnosis: Other abnormalities of gait and mobility (R26.89);Muscle weakness (generalized) (M62.81)     Time: 1610-9604 PT Time Calculation (min) (ACUTE ONLY): 17 min  Charges:    $Gait Training: 8-22 mins PT General Charges $$ ACUTE PT VISIT: 1 Visit                     Anise Salvo, PT Acute Rehab Services [facility] [phone]    Rayetta Humphrey 04/07/2023, 2:31 PM

## 2023-04-07 NOTE — Progress Notes (Signed)
Pt report given to [facility] receiving RN; Pt had a large BM  before transport came;PTAR to transport pt to SNF.

## 2023-04-07 NOTE — TOC Progression Note (Addendum)
Transition of Care [facility]) - Progression Note    Patient Details  Name: Andrew Knight MRN: [id number] Date of Birth: Jun 18, 1946  Transition of Care [facility]) CM/SW Contact  Lorri Frederick, LCSW Phone Number: 04/07/2023, 12:00 PM  Clinical Narrative:   CSW spoke with pt and provided bed offers.  Pt asking for response from [facility].  Asking CSW to wait until wife arrives.  1000:[facility] does offer bed.  1045: CSW spoke with pt and wife, they do want to accept offer from [facility].    1145: Auth request submitted in [place] and approved: [id number], 3 days: 9/16-9/18.   CSW confirmed with Nikki/[facility] that they can receive pt today.    Expected Discharge Plan: Skilled Nursing Facility Barriers to Discharge: Continued Medical Work up  Expected Discharge Plan and Services In-house Referral: Clinical Social Work     Living arrangements for the past 2 months: Single Family Home                                       Social Determinants of Health (SDOH) Interventions SDOH Screenings   Food Insecurity: No Food Insecurity (04/03/2023)  Housing: Low Risk  (04/03/2023)  Transportation Needs: No Transportation Needs (04/03/2023)  Utilities: Not At Risk (04/03/2023)  Tobacco Use: Medium Risk (04/03/2023)    Readmission Risk Interventions     No data to display

## 2023-04-07 NOTE — Care Management Important Message (Signed)
Important Message  Patient Details  Name: SHAYQUAN NEWSTROM MRN: [id number] Date of Birth: [birth year]   Medicare Important Message Given:  Yes     Sherilyn Banker 04/07/2023, 1:12 PM

## 2023-04-07 NOTE — Plan of Care (Signed)
Problem: Coping: Goal: Level of anxiety will decrease Outcome: Progressing   Problem: Pain Managment: Goal: General experience of comfort will improve Outcome: Progressing   Problem: Safety: Goal: Ability to remain free from injury will improve Outcome: Progressing

## 2023-04-07 NOTE — TOC Transition Note (Signed)
Transition of Care [facility]) - CM/SW Discharge Note   Patient Details  Name: Andrew Knight MRN: [id number] Date of Birth: 06-Mar-1946  Transition of Care [facility]) CM/SW Contact:  Lorri Frederick, LCSW Phone Number: 04/07/2023, 1:17 PM   Clinical Narrative:   Pt discharging to [facility], room 511.  RN call report to [phone].     Final next level of care: Skilled Nursing Facility Barriers to Discharge: Barriers Resolved   Patient Goals and CMS Choice CMS Medicare.gov Compare Post Acute Care list provided to:: Patient Choice offered to / list presented to : Patient  Discharge Placement                Patient chooses bed at: [facility] Patient to be transferred to facility by: [facility] Name of family member notified: wife Zella Ball in room Patient and family notified of of transfer: 04/07/23  Discharge Plan and Services Additional resources added to the After Visit Summary for   In-house Referral: Clinical Social Work                                   Social Determinants of Health (SDOH) Interventions SDOH Screenings   Food Insecurity: No Food Insecurity (04/03/2023)  Housing: Low Risk  (04/03/2023)  Transportation Needs: No Transportation Needs (04/03/2023)  Utilities: Not At Risk (04/03/2023)  Tobacco Use: Medium Risk (04/03/2023)     Readmission Risk Interventions     No data to display

## 2023-04-07 NOTE — Progress Notes (Signed)
   04/07/23 1131  Mobility  Activity Ambulated with assistance to bathroom;Ambulated with assistance in room  Level of Assistance Contact guard assist, steadying assist (ModA for Bed Mobility, MinA for STS)  Assistive Device Front wheel walker  Distance Ambulated (ft) 20 ft  RLE Weight Bearing WBAT  Activity Response Tolerated well  Mobility Referral Yes  $Mobility charge 1 Mobility  Mobility Specialist Start Time (ACUTE ONLY) 1045  Mobility Specialist Stop Time (ACUTE ONLY) 1122  Mobility Specialist Time Calculation (min) (ACUTE ONLY) 37 min   Mobility Specialist: Progress Note  Pt agreeable to mobility session - received in bed. Required ModA for Bed Mobility with use of bed rails, MinA for STS, CG for ambulation using RW to and from BR. Pt with slow gait throughout and c/o not having BM. Light yellow void complete. Pt returned to chair with all needs met - call bell within reach. Wife present.   Barnie Mort, BS Mobility Specialist Please contact via SecureChat or Rehab office at [phone].

## 2023-04-09 DIAGNOSIS — M6281 Muscle weakness (generalized): Secondary | ICD-10-CM | POA: Diagnosis not present

## 2023-04-10 DIAGNOSIS — R2689 Other abnormalities of gait and mobility: Secondary | ICD-10-CM | POA: Diagnosis not present

## 2023-04-10 DIAGNOSIS — R2681 Unsteadiness on feet: Secondary | ICD-10-CM | POA: Diagnosis not present

## 2023-04-10 DIAGNOSIS — M6281 Muscle weakness (generalized): Secondary | ICD-10-CM | POA: Diagnosis not present

## 2023-04-10 DIAGNOSIS — Z9181 History of falling: Secondary | ICD-10-CM | POA: Diagnosis not present

## 2023-04-10 DIAGNOSIS — S7221XD Displaced subtrochanteric fracture of right femur, subsequent encounter for closed fracture with routine healing: Secondary | ICD-10-CM | POA: Diagnosis not present

## 2023-04-14 DIAGNOSIS — R2681 Unsteadiness on feet: Secondary | ICD-10-CM | POA: Diagnosis not present

## 2023-04-14 DIAGNOSIS — Z9181 History of falling: Secondary | ICD-10-CM | POA: Diagnosis not present

## 2023-04-14 DIAGNOSIS — S7221XD Displaced subtrochanteric fracture of right femur, subsequent encounter for closed fracture with routine healing: Secondary | ICD-10-CM | POA: Diagnosis not present

## 2023-04-14 DIAGNOSIS — R2689 Other abnormalities of gait and mobility: Secondary | ICD-10-CM | POA: Diagnosis not present

## 2023-04-14 DIAGNOSIS — M6281 Muscle weakness (generalized): Secondary | ICD-10-CM | POA: Diagnosis not present

## 2023-04-17 DIAGNOSIS — Z9181 History of falling: Secondary | ICD-10-CM | POA: Diagnosis not present

## 2023-04-17 DIAGNOSIS — M6281 Muscle weakness (generalized): Secondary | ICD-10-CM | POA: Diagnosis not present

## 2023-04-17 DIAGNOSIS — R2681 Unsteadiness on feet: Secondary | ICD-10-CM | POA: Diagnosis not present

## 2023-04-17 DIAGNOSIS — S7221XD Displaced subtrochanteric fracture of right femur, subsequent encounter for closed fracture with routine healing: Secondary | ICD-10-CM | POA: Diagnosis not present

## 2023-04-17 DIAGNOSIS — R2689 Other abnormalities of gait and mobility: Secondary | ICD-10-CM | POA: Diagnosis not present

## 2023-04-18 DIAGNOSIS — S72141A Displaced intertrochanteric fracture of right femur, initial encounter for closed fracture: Secondary | ICD-10-CM | POA: Diagnosis not present

## 2023-04-19 DIAGNOSIS — Z9181 History of falling: Secondary | ICD-10-CM | POA: Diagnosis not present

## 2023-04-19 DIAGNOSIS — R2689 Other abnormalities of gait and mobility: Secondary | ICD-10-CM | POA: Diagnosis not present

## 2023-04-19 DIAGNOSIS — S7222XA Displaced subtrochanteric fracture of left femur, initial encounter for closed fracture: Secondary | ICD-10-CM | POA: Diagnosis not present

## 2023-04-19 DIAGNOSIS — E871 Hypo-osmolality and hyponatremia: Secondary | ICD-10-CM | POA: Diagnosis not present

## 2023-04-22 ENCOUNTER — Ambulatory Visit (HOSPITAL_COMMUNITY)
Admission: RE | Admit: 2023-04-22 | Discharge: 2023-04-22 | Disposition: A | Discharge status: Home / Self Care | Payer: Medicare PPO | Source: Ambulatory Visit | Attending: Cardiology | Admitting: Cardiology

## 2023-04-22 ENCOUNTER — Other Ambulatory Visit (HOSPITAL_COMMUNITY): Payer: Self-pay | Admitting: Orthopedic Surgery

## 2023-04-22 DIAGNOSIS — M7989 Other specified soft tissue disorders: Secondary | ICD-10-CM | POA: Insufficient documentation

## 2023-04-22 DIAGNOSIS — M79604 Pain in right leg: Secondary | ICD-10-CM

## 2023-04-23 DIAGNOSIS — J302 Other seasonal allergic rhinitis: Secondary | ICD-10-CM | POA: Diagnosis not present

## 2023-04-23 DIAGNOSIS — E559 Vitamin D deficiency, unspecified: Secondary | ICD-10-CM | POA: Diagnosis not present

## 2023-04-23 DIAGNOSIS — I2510 Atherosclerotic heart disease of native coronary artery without angina pectoris: Secondary | ICD-10-CM | POA: Diagnosis not present

## 2023-04-23 DIAGNOSIS — R1312 Dysphagia, oropharyngeal phase: Secondary | ICD-10-CM | POA: Diagnosis not present

## 2023-04-23 DIAGNOSIS — S7221XD Displaced subtrochanteric fracture of right femur, subsequent encounter for closed fracture with routine healing: Secondary | ICD-10-CM | POA: Diagnosis not present

## 2023-04-23 DIAGNOSIS — H9191 Unspecified hearing loss, right ear: Secondary | ICD-10-CM | POA: Diagnosis not present

## 2023-04-23 DIAGNOSIS — D649 Anemia, unspecified: Secondary | ICD-10-CM | POA: Diagnosis not present

## 2023-04-23 DIAGNOSIS — E871 Hypo-osmolality and hyponatremia: Secondary | ICD-10-CM | POA: Diagnosis not present

## 2023-04-23 DIAGNOSIS — R42 Dizziness and giddiness: Secondary | ICD-10-CM | POA: Diagnosis not present

## 2023-04-24 DIAGNOSIS — Z8781 Personal history of (healed) traumatic fracture: Secondary | ICD-10-CM | POA: Diagnosis not present

## 2023-04-24 DIAGNOSIS — R195 Other fecal abnormalities: Secondary | ICD-10-CM | POA: Diagnosis not present

## 2023-04-24 DIAGNOSIS — R972 Elevated prostate specific antigen [PSA]: Secondary | ICD-10-CM | POA: Diagnosis not present

## 2023-04-24 DIAGNOSIS — K5900 Constipation, unspecified: Secondary | ICD-10-CM | POA: Diagnosis not present

## 2023-04-26 DIAGNOSIS — S72141D Displaced intertrochanteric fracture of right femur, subsequent encounter for closed fracture with routine healing: Secondary | ICD-10-CM | POA: Diagnosis not present

## 2023-04-30 DIAGNOSIS — R351 Nocturia: Secondary | ICD-10-CM | POA: Diagnosis not present

## 2023-04-30 DIAGNOSIS — E559 Vitamin D deficiency, unspecified: Secondary | ICD-10-CM | POA: Diagnosis not present

## 2023-04-30 DIAGNOSIS — H9191 Unspecified hearing loss, right ear: Secondary | ICD-10-CM | POA: Diagnosis not present

## 2023-04-30 DIAGNOSIS — J302 Other seasonal allergic rhinitis: Secondary | ICD-10-CM | POA: Diagnosis not present

## 2023-04-30 DIAGNOSIS — I2510 Atherosclerotic heart disease of native coronary artery without angina pectoris: Secondary | ICD-10-CM | POA: Diagnosis not present

## 2023-04-30 DIAGNOSIS — R972 Elevated prostate specific antigen [PSA]: Secondary | ICD-10-CM | POA: Diagnosis not present

## 2023-04-30 DIAGNOSIS — N401 Enlarged prostate with lower urinary tract symptoms: Secondary | ICD-10-CM | POA: Diagnosis not present

## 2023-04-30 DIAGNOSIS — D649 Anemia, unspecified: Secondary | ICD-10-CM | POA: Diagnosis not present

## 2023-04-30 DIAGNOSIS — E871 Hypo-osmolality and hyponatremia: Secondary | ICD-10-CM | POA: Diagnosis not present

## 2023-04-30 DIAGNOSIS — R1312 Dysphagia, oropharyngeal phase: Secondary | ICD-10-CM | POA: Diagnosis not present

## 2023-04-30 DIAGNOSIS — R42 Dizziness and giddiness: Secondary | ICD-10-CM | POA: Diagnosis not present

## 2023-04-30 DIAGNOSIS — S7221XD Displaced subtrochanteric fracture of right femur, subsequent encounter for closed fracture with routine healing: Secondary | ICD-10-CM | POA: Diagnosis not present

## 2023-04-30 DIAGNOSIS — N4232 Atypical small acinar proliferation of prostate: Secondary | ICD-10-CM | POA: Diagnosis not present

## 2023-05-01 ENCOUNTER — Other Ambulatory Visit: Payer: Self-pay | Admitting: Urology

## 2023-05-01 DIAGNOSIS — R1312 Dysphagia, oropharyngeal phase: Secondary | ICD-10-CM | POA: Diagnosis not present

## 2023-05-01 DIAGNOSIS — R972 Elevated prostate specific antigen [PSA]: Secondary | ICD-10-CM

## 2023-05-01 DIAGNOSIS — R42 Dizziness and giddiness: Secondary | ICD-10-CM | POA: Diagnosis not present

## 2023-05-01 DIAGNOSIS — J302 Other seasonal allergic rhinitis: Secondary | ICD-10-CM | POA: Diagnosis not present

## 2023-05-01 DIAGNOSIS — E559 Vitamin D deficiency, unspecified: Secondary | ICD-10-CM | POA: Diagnosis not present

## 2023-05-01 DIAGNOSIS — N4232 Atypical small acinar proliferation of prostate: Secondary | ICD-10-CM

## 2023-05-01 DIAGNOSIS — H9191 Unspecified hearing loss, right ear: Secondary | ICD-10-CM | POA: Diagnosis not present

## 2023-05-01 DIAGNOSIS — I2510 Atherosclerotic heart disease of native coronary artery without angina pectoris: Secondary | ICD-10-CM | POA: Diagnosis not present

## 2023-05-01 DIAGNOSIS — S7221XD Displaced subtrochanteric fracture of right femur, subsequent encounter for closed fracture with routine healing: Secondary | ICD-10-CM | POA: Diagnosis not present

## 2023-05-01 DIAGNOSIS — E871 Hypo-osmolality and hyponatremia: Secondary | ICD-10-CM | POA: Diagnosis not present

## 2023-05-01 DIAGNOSIS — D649 Anemia, unspecified: Secondary | ICD-10-CM | POA: Diagnosis not present

## 2023-05-06 DIAGNOSIS — S72141D Displaced intertrochanteric fracture of right femur, subsequent encounter for closed fracture with routine healing: Secondary | ICD-10-CM | POA: Diagnosis not present

## 2023-05-07 DIAGNOSIS — D649 Anemia, unspecified: Secondary | ICD-10-CM | POA: Diagnosis not present

## 2023-05-07 DIAGNOSIS — S7221XD Displaced subtrochanteric fracture of right femur, subsequent encounter for closed fracture with routine healing: Secondary | ICD-10-CM | POA: Diagnosis not present

## 2023-05-07 DIAGNOSIS — J302 Other seasonal allergic rhinitis: Secondary | ICD-10-CM | POA: Diagnosis not present

## 2023-05-07 DIAGNOSIS — H9191 Unspecified hearing loss, right ear: Secondary | ICD-10-CM | POA: Diagnosis not present

## 2023-05-07 DIAGNOSIS — E559 Vitamin D deficiency, unspecified: Secondary | ICD-10-CM | POA: Diagnosis not present

## 2023-05-07 DIAGNOSIS — I2510 Atherosclerotic heart disease of native coronary artery without angina pectoris: Secondary | ICD-10-CM | POA: Diagnosis not present

## 2023-05-07 DIAGNOSIS — E871 Hypo-osmolality and hyponatremia: Secondary | ICD-10-CM | POA: Diagnosis not present

## 2023-05-07 DIAGNOSIS — R42 Dizziness and giddiness: Secondary | ICD-10-CM | POA: Diagnosis not present

## 2023-05-07 DIAGNOSIS — R1312 Dysphagia, oropharyngeal phase: Secondary | ICD-10-CM | POA: Diagnosis not present

## 2023-05-13 DIAGNOSIS — J302 Other seasonal allergic rhinitis: Secondary | ICD-10-CM | POA: Diagnosis not present

## 2023-05-13 DIAGNOSIS — R42 Dizziness and giddiness: Secondary | ICD-10-CM | POA: Diagnosis not present

## 2023-05-13 DIAGNOSIS — H9191 Unspecified hearing loss, right ear: Secondary | ICD-10-CM | POA: Diagnosis not present

## 2023-05-13 DIAGNOSIS — E871 Hypo-osmolality and hyponatremia: Secondary | ICD-10-CM | POA: Diagnosis not present

## 2023-05-13 DIAGNOSIS — D649 Anemia, unspecified: Secondary | ICD-10-CM | POA: Diagnosis not present

## 2023-05-13 DIAGNOSIS — R1312 Dysphagia, oropharyngeal phase: Secondary | ICD-10-CM | POA: Diagnosis not present

## 2023-05-13 DIAGNOSIS — E559 Vitamin D deficiency, unspecified: Secondary | ICD-10-CM | POA: Diagnosis not present

## 2023-05-13 DIAGNOSIS — I2510 Atherosclerotic heart disease of native coronary artery without angina pectoris: Secondary | ICD-10-CM | POA: Diagnosis not present

## 2023-05-13 DIAGNOSIS — S7221XD Displaced subtrochanteric fracture of right femur, subsequent encounter for closed fracture with routine healing: Secondary | ICD-10-CM | POA: Diagnosis not present

## 2023-05-14 DIAGNOSIS — S7221XD Displaced subtrochanteric fracture of right femur, subsequent encounter for closed fracture with routine healing: Secondary | ICD-10-CM | POA: Diagnosis not present

## 2023-05-14 DIAGNOSIS — D649 Anemia, unspecified: Secondary | ICD-10-CM | POA: Diagnosis not present

## 2023-05-14 DIAGNOSIS — R42 Dizziness and giddiness: Secondary | ICD-10-CM | POA: Diagnosis not present

## 2023-05-14 DIAGNOSIS — H9191 Unspecified hearing loss, right ear: Secondary | ICD-10-CM | POA: Diagnosis not present

## 2023-05-14 DIAGNOSIS — E871 Hypo-osmolality and hyponatremia: Secondary | ICD-10-CM | POA: Diagnosis not present

## 2023-05-14 DIAGNOSIS — I2510 Atherosclerotic heart disease of native coronary artery without angina pectoris: Secondary | ICD-10-CM | POA: Diagnosis not present

## 2023-05-14 DIAGNOSIS — E559 Vitamin D deficiency, unspecified: Secondary | ICD-10-CM | POA: Diagnosis not present

## 2023-05-14 DIAGNOSIS — R1312 Dysphagia, oropharyngeal phase: Secondary | ICD-10-CM | POA: Diagnosis not present

## 2023-05-14 DIAGNOSIS — J302 Other seasonal allergic rhinitis: Secondary | ICD-10-CM | POA: Diagnosis not present

## 2023-05-16 DIAGNOSIS — I2510 Atherosclerotic heart disease of native coronary artery without angina pectoris: Secondary | ICD-10-CM | POA: Diagnosis not present

## 2023-05-16 DIAGNOSIS — S7221XD Displaced subtrochanteric fracture of right femur, subsequent encounter for closed fracture with routine healing: Secondary | ICD-10-CM | POA: Diagnosis not present

## 2023-05-16 DIAGNOSIS — D649 Anemia, unspecified: Secondary | ICD-10-CM | POA: Diagnosis not present

## 2023-05-16 DIAGNOSIS — J302 Other seasonal allergic rhinitis: Secondary | ICD-10-CM | POA: Diagnosis not present

## 2023-05-19 DIAGNOSIS — I2510 Atherosclerotic heart disease of native coronary artery without angina pectoris: Secondary | ICD-10-CM | POA: Diagnosis not present

## 2023-05-19 DIAGNOSIS — E559 Vitamin D deficiency, unspecified: Secondary | ICD-10-CM | POA: Diagnosis not present

## 2023-05-19 DIAGNOSIS — R1312 Dysphagia, oropharyngeal phase: Secondary | ICD-10-CM | POA: Diagnosis not present

## 2023-05-19 DIAGNOSIS — E871 Hypo-osmolality and hyponatremia: Secondary | ICD-10-CM | POA: Diagnosis not present

## 2023-05-19 DIAGNOSIS — D649 Anemia, unspecified: Secondary | ICD-10-CM | POA: Diagnosis not present

## 2023-05-19 DIAGNOSIS — S7221XD Displaced subtrochanteric fracture of right femur, subsequent encounter for closed fracture with routine healing: Secondary | ICD-10-CM | POA: Diagnosis not present

## 2023-05-19 DIAGNOSIS — R42 Dizziness and giddiness: Secondary | ICD-10-CM | POA: Diagnosis not present

## 2023-05-19 DIAGNOSIS — S72141D Displaced intertrochanteric fracture of right femur, subsequent encounter for closed fracture with routine healing: Secondary | ICD-10-CM | POA: Diagnosis not present

## 2023-05-19 DIAGNOSIS — J302 Other seasonal allergic rhinitis: Secondary | ICD-10-CM | POA: Diagnosis not present

## 2023-05-19 DIAGNOSIS — H9191 Unspecified hearing loss, right ear: Secondary | ICD-10-CM | POA: Diagnosis not present

## 2023-05-21 ENCOUNTER — Ambulatory Visit (INDEPENDENT_AMBULATORY_CARE_PROVIDER_SITE_OTHER): Payer: Medicare PPO | Admitting: Orthopaedic Surgery

## 2023-05-21 ENCOUNTER — Encounter: Payer: Self-pay | Admitting: Orthopaedic Surgery

## 2023-05-21 DIAGNOSIS — S72141D Displaced intertrochanteric fracture of right femur, subsequent encounter for closed fracture with routine healing: Secondary | ICD-10-CM

## 2023-05-21 DIAGNOSIS — E559 Vitamin D deficiency, unspecified: Secondary | ICD-10-CM | POA: Diagnosis not present

## 2023-05-21 DIAGNOSIS — I2510 Atherosclerotic heart disease of native coronary artery without angina pectoris: Secondary | ICD-10-CM | POA: Diagnosis not present

## 2023-05-21 DIAGNOSIS — J302 Other seasonal allergic rhinitis: Secondary | ICD-10-CM | POA: Diagnosis not present

## 2023-05-21 DIAGNOSIS — E871 Hypo-osmolality and hyponatremia: Secondary | ICD-10-CM | POA: Diagnosis not present

## 2023-05-21 DIAGNOSIS — H9191 Unspecified hearing loss, right ear: Secondary | ICD-10-CM | POA: Diagnosis not present

## 2023-05-21 DIAGNOSIS — S7221XD Displaced subtrochanteric fracture of right femur, subsequent encounter for closed fracture with routine healing: Secondary | ICD-10-CM | POA: Diagnosis not present

## 2023-05-21 DIAGNOSIS — D649 Anemia, unspecified: Secondary | ICD-10-CM | POA: Diagnosis not present

## 2023-05-21 DIAGNOSIS — R42 Dizziness and giddiness: Secondary | ICD-10-CM | POA: Diagnosis not present

## 2023-05-21 DIAGNOSIS — R1312 Dysphagia, oropharyngeal phase: Secondary | ICD-10-CM | POA: Diagnosis not present

## 2023-05-21 NOTE — Progress Notes (Signed)
The patient is a very pleasant 77 year old gentleman who is almost 7 weeks out from a complex right hip intertrochanteric fracture.  One of my colleagues in town fix this appropriately with an intramedullary/cephalomedullary rod and hip screw construct.  This was the [facility] InterTAN rod with interdigitating lag screw compression screw construct.  He is here mainly just for second opinion as a relates to the healing process.  He is ambulating with a walker.  He has been through therapy at skilled nursing and he feels like they may have done a little bit too much for him.  However he feels like he is doing well overall.  He still ambulating with a walker.  Prior to his mechanical fall injuring his hip he did not use any assistive device at all and never had any problems with his balance and coordination.  This was just an accidental fall causing this injury.  On exam he has well-healed surgical incisions from his surgery.  He tolerates me easily putting his right operative hip through internal extra rotation with just some mild pain.  When I do have him lay in a supine position he does have a leg length difference with his right side slightly shorter than the left.  X-rays from earlier this week show a reverse obliquity intertrochanteric fracture with intact hardware.  There has been some interval healing.  His hip joint space on both hips is very well-maintained.  I was able to see the interoperative x-rays as well and the fracture has collapsed some but this is certainly appropriate.  There is no evidence of hardware failure.  We had a long thorough discussion about his fracture and his surgery.  I think everything has been done very well and appropriate and he is heading toward healing this fracture appropriately.  He may end up benefiting from an insert in his right shoe to help with his leg length difference for the long run in terms of his balance and his posture.  He does have a regular  follow-up in 4 weeks with his treating orthopedic surgeon and he should keep that follow-up for continued x-rays until he is healed this fracture.  I did let him know that my mother had the exact same surgery and fracture several years ago and I felt like it took her a good 3 to 6 months to really feel better and heal the fracture itself.  He is heading in the right direction and this gave him reassurance.  Apparently he has aqua therapy, not and I think that is reasonable as well.

## 2023-05-27 DIAGNOSIS — M25551 Pain in right hip: Secondary | ICD-10-CM | POA: Diagnosis not present

## 2023-05-27 DIAGNOSIS — R260 Ataxic gait: Secondary | ICD-10-CM | POA: Diagnosis not present

## 2023-05-29 DIAGNOSIS — M25551 Pain in right hip: Secondary | ICD-10-CM | POA: Diagnosis not present

## 2023-05-29 DIAGNOSIS — R260 Ataxic gait: Secondary | ICD-10-CM | POA: Diagnosis not present

## 2023-06-02 DIAGNOSIS — R260 Ataxic gait: Secondary | ICD-10-CM | POA: Diagnosis not present

## 2023-06-02 DIAGNOSIS — M25551 Pain in right hip: Secondary | ICD-10-CM | POA: Diagnosis not present

## 2023-06-04 DIAGNOSIS — M25551 Pain in right hip: Secondary | ICD-10-CM | POA: Diagnosis not present

## 2023-06-04 DIAGNOSIS — R260 Ataxic gait: Secondary | ICD-10-CM | POA: Diagnosis not present

## 2023-06-06 DIAGNOSIS — R260 Ataxic gait: Secondary | ICD-10-CM | POA: Diagnosis not present

## 2023-06-06 DIAGNOSIS — M25551 Pain in right hip: Secondary | ICD-10-CM | POA: Diagnosis not present

## 2023-06-09 ENCOUNTER — Ambulatory Visit (HOSPITAL_BASED_OUTPATIENT_CLINIC_OR_DEPARTMENT_OTHER): Payer: Medicare PPO | Admitting: Physical Therapy

## 2023-06-09 DIAGNOSIS — R260 Ataxic gait: Secondary | ICD-10-CM | POA: Diagnosis not present

## 2023-06-09 DIAGNOSIS — M25551 Pain in right hip: Secondary | ICD-10-CM | POA: Diagnosis not present

## 2023-06-11 DIAGNOSIS — R260 Ataxic gait: Secondary | ICD-10-CM | POA: Diagnosis not present

## 2023-06-11 DIAGNOSIS — S72144S Nondisplaced intertrochanteric fracture of right femur, sequela: Secondary | ICD-10-CM | POA: Diagnosis not present

## 2023-06-11 DIAGNOSIS — M25551 Pain in right hip: Secondary | ICD-10-CM | POA: Diagnosis not present

## 2023-06-13 DIAGNOSIS — R260 Ataxic gait: Secondary | ICD-10-CM | POA: Diagnosis not present

## 2023-06-13 DIAGNOSIS — S72144S Nondisplaced intertrochanteric fracture of right femur, sequela: Secondary | ICD-10-CM | POA: Diagnosis not present

## 2023-06-13 DIAGNOSIS — S72141D Displaced intertrochanteric fracture of right femur, subsequent encounter for closed fracture with routine healing: Secondary | ICD-10-CM | POA: Diagnosis not present

## 2023-06-13 DIAGNOSIS — M25551 Pain in right hip: Secondary | ICD-10-CM | POA: Diagnosis not present

## 2023-06-25 DIAGNOSIS — R260 Ataxic gait: Secondary | ICD-10-CM | POA: Diagnosis not present

## 2023-06-25 DIAGNOSIS — S72144S Nondisplaced intertrochanteric fracture of right femur, sequela: Secondary | ICD-10-CM | POA: Diagnosis not present

## 2023-06-25 DIAGNOSIS — M25551 Pain in right hip: Secondary | ICD-10-CM | POA: Diagnosis not present

## 2023-06-27 DIAGNOSIS — S72144S Nondisplaced intertrochanteric fracture of right femur, sequela: Secondary | ICD-10-CM | POA: Diagnosis not present

## 2023-06-27 DIAGNOSIS — M25551 Pain in right hip: Secondary | ICD-10-CM | POA: Diagnosis not present

## 2023-06-27 DIAGNOSIS — R260 Ataxic gait: Secondary | ICD-10-CM | POA: Diagnosis not present

## 2023-07-02 DIAGNOSIS — R260 Ataxic gait: Secondary | ICD-10-CM | POA: Diagnosis not present

## 2023-07-02 DIAGNOSIS — S72144S Nondisplaced intertrochanteric fracture of right femur, sequela: Secondary | ICD-10-CM | POA: Diagnosis not present

## 2023-07-02 DIAGNOSIS — M25551 Pain in right hip: Secondary | ICD-10-CM | POA: Diagnosis not present

## 2023-07-04 DIAGNOSIS — M25551 Pain in right hip: Secondary | ICD-10-CM | POA: Diagnosis not present

## 2023-07-04 DIAGNOSIS — R260 Ataxic gait: Secondary | ICD-10-CM | POA: Diagnosis not present

## 2023-07-04 DIAGNOSIS — S72144S Nondisplaced intertrochanteric fracture of right femur, sequela: Secondary | ICD-10-CM | POA: Diagnosis not present

## 2023-07-09 DIAGNOSIS — S72144S Nondisplaced intertrochanteric fracture of right femur, sequela: Secondary | ICD-10-CM | POA: Diagnosis not present

## 2023-07-09 DIAGNOSIS — R260 Ataxic gait: Secondary | ICD-10-CM | POA: Diagnosis not present

## 2023-07-09 DIAGNOSIS — M25551 Pain in right hip: Secondary | ICD-10-CM | POA: Diagnosis not present

## 2023-07-11 DIAGNOSIS — R260 Ataxic gait: Secondary | ICD-10-CM | POA: Diagnosis not present

## 2023-07-11 DIAGNOSIS — S72144S Nondisplaced intertrochanteric fracture of right femur, sequela: Secondary | ICD-10-CM | POA: Diagnosis not present

## 2023-07-11 DIAGNOSIS — M25551 Pain in right hip: Secondary | ICD-10-CM | POA: Diagnosis not present

## 2023-07-22 DIAGNOSIS — R260 Ataxic gait: Secondary | ICD-10-CM | POA: Diagnosis not present

## 2023-07-22 DIAGNOSIS — M25551 Pain in right hip: Secondary | ICD-10-CM | POA: Diagnosis not present

## 2023-07-22 DIAGNOSIS — S72144S Nondisplaced intertrochanteric fracture of right femur, sequela: Secondary | ICD-10-CM | POA: Diagnosis not present

## 2023-07-25 DIAGNOSIS — S72144S Nondisplaced intertrochanteric fracture of right femur, sequela: Secondary | ICD-10-CM | POA: Diagnosis not present

## 2023-07-25 DIAGNOSIS — M25551 Pain in right hip: Secondary | ICD-10-CM | POA: Diagnosis not present

## 2023-07-25 DIAGNOSIS — R260 Ataxic gait: Secondary | ICD-10-CM | POA: Diagnosis not present

## 2023-07-29 DIAGNOSIS — S72144S Nondisplaced intertrochanteric fracture of right femur, sequela: Secondary | ICD-10-CM | POA: Diagnosis not present

## 2023-07-29 DIAGNOSIS — R260 Ataxic gait: Secondary | ICD-10-CM | POA: Diagnosis not present

## 2023-07-29 DIAGNOSIS — M25551 Pain in right hip: Secondary | ICD-10-CM | POA: Diagnosis not present

## 2023-08-01 DIAGNOSIS — S72144S Nondisplaced intertrochanteric fracture of right femur, sequela: Secondary | ICD-10-CM | POA: Diagnosis not present

## 2023-08-01 DIAGNOSIS — M25551 Pain in right hip: Secondary | ICD-10-CM | POA: Diagnosis not present

## 2023-08-01 DIAGNOSIS — R260 Ataxic gait: Secondary | ICD-10-CM | POA: Diagnosis not present

## 2023-08-05 DIAGNOSIS — S72144S Nondisplaced intertrochanteric fracture of right femur, sequela: Secondary | ICD-10-CM | POA: Diagnosis not present

## 2023-08-05 DIAGNOSIS — M25551 Pain in right hip: Secondary | ICD-10-CM | POA: Diagnosis not present

## 2023-08-05 DIAGNOSIS — R260 Ataxic gait: Secondary | ICD-10-CM | POA: Diagnosis not present

## 2023-08-08 DIAGNOSIS — S72144S Nondisplaced intertrochanteric fracture of right femur, sequela: Secondary | ICD-10-CM | POA: Diagnosis not present

## 2023-08-08 DIAGNOSIS — M25551 Pain in right hip: Secondary | ICD-10-CM | POA: Diagnosis not present

## 2023-08-08 DIAGNOSIS — R260 Ataxic gait: Secondary | ICD-10-CM | POA: Diagnosis not present

## 2023-08-12 DIAGNOSIS — R260 Ataxic gait: Secondary | ICD-10-CM | POA: Diagnosis not present

## 2023-08-12 DIAGNOSIS — M25551 Pain in right hip: Secondary | ICD-10-CM | POA: Diagnosis not present

## 2023-08-12 DIAGNOSIS — S72144S Nondisplaced intertrochanteric fracture of right femur, sequela: Secondary | ICD-10-CM | POA: Diagnosis not present

## 2023-08-15 DIAGNOSIS — S72144S Nondisplaced intertrochanteric fracture of right femur, sequela: Secondary | ICD-10-CM | POA: Diagnosis not present

## 2023-08-15 DIAGNOSIS — R260 Ataxic gait: Secondary | ICD-10-CM | POA: Diagnosis not present

## 2023-08-15 DIAGNOSIS — M25551 Pain in right hip: Secondary | ICD-10-CM | POA: Diagnosis not present

## 2023-08-19 DIAGNOSIS — S72144S Nondisplaced intertrochanteric fracture of right femur, sequela: Secondary | ICD-10-CM | POA: Diagnosis not present

## 2023-08-19 DIAGNOSIS — M25551 Pain in right hip: Secondary | ICD-10-CM | POA: Diagnosis not present

## 2023-08-19 DIAGNOSIS — R260 Ataxic gait: Secondary | ICD-10-CM | POA: Diagnosis not present

## 2023-08-22 DIAGNOSIS — S72144S Nondisplaced intertrochanteric fracture of right femur, sequela: Secondary | ICD-10-CM | POA: Diagnosis not present

## 2023-08-22 DIAGNOSIS — R260 Ataxic gait: Secondary | ICD-10-CM | POA: Diagnosis not present

## 2023-08-22 DIAGNOSIS — M25551 Pain in right hip: Secondary | ICD-10-CM | POA: Diagnosis not present

## 2023-08-25 DIAGNOSIS — M25551 Pain in right hip: Secondary | ICD-10-CM | POA: Diagnosis not present

## 2023-08-25 DIAGNOSIS — S72144S Nondisplaced intertrochanteric fracture of right femur, sequela: Secondary | ICD-10-CM | POA: Diagnosis not present

## 2023-08-25 DIAGNOSIS — R260 Ataxic gait: Secondary | ICD-10-CM | POA: Diagnosis not present

## 2023-08-27 DIAGNOSIS — D123 Benign neoplasm of transverse colon: Secondary | ICD-10-CM | POA: Diagnosis not present

## 2023-08-27 DIAGNOSIS — R195 Other fecal abnormalities: Secondary | ICD-10-CM | POA: Diagnosis not present

## 2023-08-27 DIAGNOSIS — K5730 Diverticulosis of large intestine without perforation or abscess without bleeding: Secondary | ICD-10-CM | POA: Diagnosis not present

## 2023-08-27 DIAGNOSIS — D122 Benign neoplasm of ascending colon: Secondary | ICD-10-CM | POA: Diagnosis not present

## 2023-08-29 DIAGNOSIS — R260 Ataxic gait: Secondary | ICD-10-CM | POA: Diagnosis not present

## 2023-08-29 DIAGNOSIS — D122 Benign neoplasm of ascending colon: Secondary | ICD-10-CM | POA: Diagnosis not present

## 2023-08-29 DIAGNOSIS — M25551 Pain in right hip: Secondary | ICD-10-CM | POA: Diagnosis not present

## 2023-08-29 DIAGNOSIS — S72144S Nondisplaced intertrochanteric fracture of right femur, sequela: Secondary | ICD-10-CM | POA: Diagnosis not present

## 2023-08-29 DIAGNOSIS — D123 Benign neoplasm of transverse colon: Secondary | ICD-10-CM | POA: Diagnosis not present

## 2023-09-01 DIAGNOSIS — R260 Ataxic gait: Secondary | ICD-10-CM | POA: Diagnosis not present

## 2023-09-01 DIAGNOSIS — M25551 Pain in right hip: Secondary | ICD-10-CM | POA: Diagnosis not present

## 2023-09-01 DIAGNOSIS — S72144S Nondisplaced intertrochanteric fracture of right femur, sequela: Secondary | ICD-10-CM | POA: Diagnosis not present

## 2023-09-16 DIAGNOSIS — S72144S Nondisplaced intertrochanteric fracture of right femur, sequela: Secondary | ICD-10-CM | POA: Diagnosis not present

## 2023-09-16 DIAGNOSIS — R260 Ataxic gait: Secondary | ICD-10-CM | POA: Diagnosis not present

## 2023-09-16 DIAGNOSIS — M25551 Pain in right hip: Secondary | ICD-10-CM | POA: Diagnosis not present

## 2023-09-18 DIAGNOSIS — M25551 Pain in right hip: Secondary | ICD-10-CM | POA: Diagnosis not present

## 2023-09-18 DIAGNOSIS — S72144S Nondisplaced intertrochanteric fracture of right femur, sequela: Secondary | ICD-10-CM | POA: Diagnosis not present

## 2023-09-18 DIAGNOSIS — R260 Ataxic gait: Secondary | ICD-10-CM | POA: Diagnosis not present

## 2023-09-24 DIAGNOSIS — M25551 Pain in right hip: Secondary | ICD-10-CM | POA: Diagnosis not present

## 2023-09-24 DIAGNOSIS — S72144S Nondisplaced intertrochanteric fracture of right femur, sequela: Secondary | ICD-10-CM | POA: Diagnosis not present

## 2023-09-24 DIAGNOSIS — R260 Ataxic gait: Secondary | ICD-10-CM | POA: Diagnosis not present

## 2023-10-01 DIAGNOSIS — R260 Ataxic gait: Secondary | ICD-10-CM | POA: Diagnosis not present

## 2023-10-01 DIAGNOSIS — S72144S Nondisplaced intertrochanteric fracture of right femur, sequela: Secondary | ICD-10-CM | POA: Diagnosis not present

## 2023-10-01 DIAGNOSIS — M25551 Pain in right hip: Secondary | ICD-10-CM | POA: Diagnosis not present

## 2023-10-08 DIAGNOSIS — M25551 Pain in right hip: Secondary | ICD-10-CM | POA: Diagnosis not present

## 2023-10-08 DIAGNOSIS — R260 Ataxic gait: Secondary | ICD-10-CM | POA: Diagnosis not present

## 2023-10-08 DIAGNOSIS — S72144S Nondisplaced intertrochanteric fracture of right femur, sequela: Secondary | ICD-10-CM | POA: Diagnosis not present

## 2023-10-15 DIAGNOSIS — S72144S Nondisplaced intertrochanteric fracture of right femur, sequela: Secondary | ICD-10-CM | POA: Diagnosis not present

## 2023-10-15 DIAGNOSIS — M25551 Pain in right hip: Secondary | ICD-10-CM | POA: Diagnosis not present

## 2023-10-15 DIAGNOSIS — R260 Ataxic gait: Secondary | ICD-10-CM | POA: Diagnosis not present

## 2023-10-22 DIAGNOSIS — S72144S Nondisplaced intertrochanteric fracture of right femur, sequela: Secondary | ICD-10-CM | POA: Diagnosis not present

## 2023-10-22 DIAGNOSIS — M25551 Pain in right hip: Secondary | ICD-10-CM | POA: Diagnosis not present

## 2023-10-22 DIAGNOSIS — R260 Ataxic gait: Secondary | ICD-10-CM | POA: Diagnosis not present

## 2023-10-29 ENCOUNTER — Encounter: Payer: Self-pay | Admitting: Urology

## 2023-10-29 DIAGNOSIS — M25551 Pain in right hip: Secondary | ICD-10-CM | POA: Diagnosis not present

## 2023-10-29 DIAGNOSIS — S72144S Nondisplaced intertrochanteric fracture of right femur, sequela: Secondary | ICD-10-CM | POA: Diagnosis not present

## 2023-10-29 DIAGNOSIS — R260 Ataxic gait: Secondary | ICD-10-CM | POA: Diagnosis not present

## 2023-10-30 ENCOUNTER — Other Ambulatory Visit: Payer: Medicare PPO

## 2023-10-30 DIAGNOSIS — D225 Melanocytic nevi of trunk: Secondary | ICD-10-CM | POA: Diagnosis not present

## 2023-10-30 DIAGNOSIS — D2262 Melanocytic nevi of left upper limb, including shoulder: Secondary | ICD-10-CM | POA: Diagnosis not present

## 2023-10-30 DIAGNOSIS — L218 Other seborrheic dermatitis: Secondary | ICD-10-CM | POA: Diagnosis not present

## 2023-10-30 DIAGNOSIS — L814 Other melanin hyperpigmentation: Secondary | ICD-10-CM | POA: Diagnosis not present

## 2023-10-30 DIAGNOSIS — Z85828 Personal history of other malignant neoplasm of skin: Secondary | ICD-10-CM | POA: Diagnosis not present

## 2023-10-30 DIAGNOSIS — L821 Other seborrheic keratosis: Secondary | ICD-10-CM | POA: Diagnosis not present

## 2023-10-30 DIAGNOSIS — L720 Epidermal cyst: Secondary | ICD-10-CM | POA: Diagnosis not present

## 2023-10-30 DIAGNOSIS — L570 Actinic keratosis: Secondary | ICD-10-CM | POA: Diagnosis not present

## 2023-10-31 ENCOUNTER — Inpatient Hospital Stay: Admission: RE | Admit: 2023-10-31 | Payer: Medicare PPO | Source: Ambulatory Visit

## 2023-11-05 DIAGNOSIS — S72144S Nondisplaced intertrochanteric fracture of right femur, sequela: Secondary | ICD-10-CM | POA: Diagnosis not present

## 2023-11-05 DIAGNOSIS — M25551 Pain in right hip: Secondary | ICD-10-CM | POA: Diagnosis not present

## 2023-11-05 DIAGNOSIS — R260 Ataxic gait: Secondary | ICD-10-CM | POA: Diagnosis not present

## 2023-11-12 DIAGNOSIS — M25551 Pain in right hip: Secondary | ICD-10-CM | POA: Diagnosis not present

## 2023-11-12 DIAGNOSIS — S72144S Nondisplaced intertrochanteric fracture of right femur, sequela: Secondary | ICD-10-CM | POA: Diagnosis not present

## 2023-11-12 DIAGNOSIS — R260 Ataxic gait: Secondary | ICD-10-CM | POA: Diagnosis not present

## 2023-11-17 DIAGNOSIS — H43813 Vitreous degeneration, bilateral: Secondary | ICD-10-CM | POA: Diagnosis not present

## 2023-11-17 DIAGNOSIS — H348112 Central retinal vein occlusion, right eye, stable: Secondary | ICD-10-CM | POA: Diagnosis not present

## 2023-11-19 DIAGNOSIS — R260 Ataxic gait: Secondary | ICD-10-CM | POA: Diagnosis not present

## 2023-11-19 DIAGNOSIS — M25551 Pain in right hip: Secondary | ICD-10-CM | POA: Diagnosis not present

## 2023-11-19 DIAGNOSIS — S72144S Nondisplaced intertrochanteric fracture of right femur, sequela: Secondary | ICD-10-CM | POA: Diagnosis not present

## 2023-11-26 DIAGNOSIS — R260 Ataxic gait: Secondary | ICD-10-CM | POA: Diagnosis not present

## 2023-11-26 DIAGNOSIS — S72144S Nondisplaced intertrochanteric fracture of right femur, sequela: Secondary | ICD-10-CM | POA: Diagnosis not present

## 2023-11-26 DIAGNOSIS — M25551 Pain in right hip: Secondary | ICD-10-CM | POA: Diagnosis not present

## 2023-12-10 DIAGNOSIS — M25551 Pain in right hip: Secondary | ICD-10-CM | POA: Diagnosis not present

## 2023-12-10 DIAGNOSIS — S72144S Nondisplaced intertrochanteric fracture of right femur, sequela: Secondary | ICD-10-CM | POA: Diagnosis not present

## 2023-12-10 DIAGNOSIS — R260 Ataxic gait: Secondary | ICD-10-CM | POA: Diagnosis not present

## 2023-12-17 ENCOUNTER — Ambulatory Visit
Admission: RE | Admit: 2023-12-17 | Discharge: 2023-12-17 | Disposition: A | Discharge status: Home / Self Care | Source: Ambulatory Visit | Attending: Urology | Admitting: Urology

## 2023-12-17 DIAGNOSIS — N4232 Atypical small acinar proliferation of prostate: Secondary | ICD-10-CM

## 2023-12-17 DIAGNOSIS — M25551 Pain in right hip: Secondary | ICD-10-CM | POA: Diagnosis not present

## 2023-12-17 DIAGNOSIS — R972 Elevated prostate specific antigen [PSA]: Secondary | ICD-10-CM | POA: Diagnosis not present

## 2023-12-17 DIAGNOSIS — R260 Ataxic gait: Secondary | ICD-10-CM | POA: Diagnosis not present

## 2023-12-17 DIAGNOSIS — S72144S Nondisplaced intertrochanteric fracture of right femur, sequela: Secondary | ICD-10-CM | POA: Diagnosis not present

## 2023-12-17 MED ORDER — GADOPICLENOL 0.5 MMOL/ML IV SOLN
10.0000 mL | Freq: Once | INTRAVENOUS | Status: AC | PRN
  Administered 2023-12-17: 10 mL via INTRAVENOUS

## 2023-12-29 DIAGNOSIS — R972 Elevated prostate specific antigen [PSA]: Secondary | ICD-10-CM | POA: Diagnosis not present

## 2023-12-31 DIAGNOSIS — M25551 Pain in right hip: Secondary | ICD-10-CM | POA: Diagnosis not present

## 2023-12-31 DIAGNOSIS — R260 Ataxic gait: Secondary | ICD-10-CM | POA: Diagnosis not present

## 2023-12-31 DIAGNOSIS — S72144S Nondisplaced intertrochanteric fracture of right femur, sequela: Secondary | ICD-10-CM | POA: Diagnosis not present

## 2024-01-05 DIAGNOSIS — N4232 Atypical small acinar proliferation of prostate: Secondary | ICD-10-CM | POA: Diagnosis not present

## 2024-01-05 DIAGNOSIS — R351 Nocturia: Secondary | ICD-10-CM | POA: Diagnosis not present

## 2024-01-05 DIAGNOSIS — R972 Elevated prostate specific antigen [PSA]: Secondary | ICD-10-CM | POA: Diagnosis not present

## 2024-01-05 DIAGNOSIS — N401 Enlarged prostate with lower urinary tract symptoms: Secondary | ICD-10-CM | POA: Diagnosis not present

## 2024-01-07 DIAGNOSIS — R260 Ataxic gait: Secondary | ICD-10-CM | POA: Diagnosis not present

## 2024-01-07 DIAGNOSIS — M25551 Pain in right hip: Secondary | ICD-10-CM | POA: Diagnosis not present

## 2024-01-07 DIAGNOSIS — S72144S Nondisplaced intertrochanteric fracture of right femur, sequela: Secondary | ICD-10-CM | POA: Diagnosis not present

## 2024-01-08 DIAGNOSIS — R972 Elevated prostate specific antigen [PSA]: Secondary | ICD-10-CM | POA: Diagnosis not present

## 2024-01-19 DIAGNOSIS — Z0000 Encounter for general adult medical examination without abnormal findings: Secondary | ICD-10-CM | POA: Diagnosis not present

## 2024-01-19 DIAGNOSIS — E785 Hyperlipidemia, unspecified: Secondary | ICD-10-CM | POA: Diagnosis not present

## 2024-01-19 DIAGNOSIS — E559 Vitamin D deficiency, unspecified: Secondary | ICD-10-CM | POA: Diagnosis not present

## 2024-01-20 DIAGNOSIS — M25551 Pain in right hip: Secondary | ICD-10-CM | POA: Diagnosis not present

## 2024-01-20 DIAGNOSIS — R260 Ataxic gait: Secondary | ICD-10-CM | POA: Diagnosis not present

## 2024-01-20 DIAGNOSIS — S72144S Nondisplaced intertrochanteric fracture of right femur, sequela: Secondary | ICD-10-CM | POA: Diagnosis not present

## 2024-01-21 DIAGNOSIS — R972 Elevated prostate specific antigen [PSA]: Secondary | ICD-10-CM | POA: Diagnosis not present

## 2024-01-21 DIAGNOSIS — E7800 Pure hypercholesterolemia, unspecified: Secondary | ICD-10-CM | POA: Diagnosis not present

## 2024-01-21 DIAGNOSIS — Z8781 Personal history of (healed) traumatic fracture: Secondary | ICD-10-CM | POA: Diagnosis not present

## 2024-01-21 DIAGNOSIS — M810 Age-related osteoporosis without current pathological fracture: Secondary | ICD-10-CM | POA: Diagnosis not present

## 2024-01-21 DIAGNOSIS — E559 Vitamin D deficiency, unspecified: Secondary | ICD-10-CM | POA: Diagnosis not present

## 2024-01-21 DIAGNOSIS — Z0000 Encounter for general adult medical examination without abnormal findings: Secondary | ICD-10-CM | POA: Diagnosis not present

## 2024-01-22 DIAGNOSIS — Z85828 Personal history of other malignant neoplasm of skin: Secondary | ICD-10-CM | POA: Diagnosis not present

## 2024-01-22 DIAGNOSIS — L720 Epidermal cyst: Secondary | ICD-10-CM | POA: Diagnosis not present

## 2024-02-11 DIAGNOSIS — S72144S Nondisplaced intertrochanteric fracture of right femur, sequela: Secondary | ICD-10-CM | POA: Diagnosis not present

## 2024-02-11 DIAGNOSIS — R260 Ataxic gait: Secondary | ICD-10-CM | POA: Diagnosis not present

## 2024-02-11 DIAGNOSIS — M25551 Pain in right hip: Secondary | ICD-10-CM | POA: Diagnosis not present

## 2024-02-18 DIAGNOSIS — S72144S Nondisplaced intertrochanteric fracture of right femur, sequela: Secondary | ICD-10-CM | POA: Diagnosis not present

## 2024-02-18 DIAGNOSIS — M25551 Pain in right hip: Secondary | ICD-10-CM | POA: Diagnosis not present

## 2024-02-18 DIAGNOSIS — R260 Ataxic gait: Secondary | ICD-10-CM | POA: Diagnosis not present

## 2024-02-25 DIAGNOSIS — R260 Ataxic gait: Secondary | ICD-10-CM | POA: Diagnosis not present

## 2024-02-25 DIAGNOSIS — S72144S Nondisplaced intertrochanteric fracture of right femur, sequela: Secondary | ICD-10-CM | POA: Diagnosis not present

## 2024-02-25 DIAGNOSIS — M25551 Pain in right hip: Secondary | ICD-10-CM | POA: Diagnosis not present

## 2024-03-03 DIAGNOSIS — S72144S Nondisplaced intertrochanteric fracture of right femur, sequela: Secondary | ICD-10-CM | POA: Diagnosis not present

## 2024-03-03 DIAGNOSIS — R260 Ataxic gait: Secondary | ICD-10-CM | POA: Diagnosis not present

## 2024-03-03 DIAGNOSIS — M25551 Pain in right hip: Secondary | ICD-10-CM | POA: Diagnosis not present

## 2024-03-10 DIAGNOSIS — M25551 Pain in right hip: Secondary | ICD-10-CM | POA: Diagnosis not present

## 2024-03-10 DIAGNOSIS — S72144S Nondisplaced intertrochanteric fracture of right femur, sequela: Secondary | ICD-10-CM | POA: Diagnosis not present

## 2024-03-10 DIAGNOSIS — R260 Ataxic gait: Secondary | ICD-10-CM | POA: Diagnosis not present

## 2024-03-15 DIAGNOSIS — Z8781 Personal history of (healed) traumatic fracture: Secondary | ICD-10-CM | POA: Diagnosis not present

## 2024-03-15 DIAGNOSIS — M8000XA Age-related osteoporosis with current pathological fracture, unspecified site, initial encounter for fracture: Secondary | ICD-10-CM | POA: Diagnosis not present

## 2024-03-29 DIAGNOSIS — Z8781 Personal history of (healed) traumatic fracture: Secondary | ICD-10-CM | POA: Diagnosis not present

## 2024-03-29 DIAGNOSIS — E049 Nontoxic goiter, unspecified: Secondary | ICD-10-CM | POA: Diagnosis not present

## 2024-03-29 DIAGNOSIS — M810 Age-related osteoporosis without current pathological fracture: Secondary | ICD-10-CM | POA: Diagnosis not present

## 2024-03-29 DIAGNOSIS — M8000XA Age-related osteoporosis with current pathological fracture, unspecified site, initial encounter for fracture: Secondary | ICD-10-CM | POA: Diagnosis not present

## 2024-03-29 DIAGNOSIS — E559 Vitamin D deficiency, unspecified: Secondary | ICD-10-CM | POA: Diagnosis not present

## 2024-03-31 DIAGNOSIS — M25551 Pain in right hip: Secondary | ICD-10-CM | POA: Diagnosis not present

## 2024-03-31 DIAGNOSIS — S72144S Nondisplaced intertrochanteric fracture of right femur, sequela: Secondary | ICD-10-CM | POA: Diagnosis not present

## 2024-03-31 DIAGNOSIS — R260 Ataxic gait: Secondary | ICD-10-CM | POA: Diagnosis not present

## 2024-04-16 DIAGNOSIS — M25551 Pain in right hip: Secondary | ICD-10-CM | POA: Diagnosis not present

## 2024-04-27 DIAGNOSIS — S72144S Nondisplaced intertrochanteric fracture of right femur, sequela: Secondary | ICD-10-CM | POA: Diagnosis not present

## 2024-04-27 DIAGNOSIS — M25551 Pain in right hip: Secondary | ICD-10-CM | POA: Diagnosis not present

## 2024-04-27 DIAGNOSIS — R260 Ataxic gait: Secondary | ICD-10-CM | POA: Diagnosis not present

## 2024-04-29 DIAGNOSIS — Z85828 Personal history of other malignant neoplasm of skin: Secondary | ICD-10-CM | POA: Diagnosis not present

## 2024-04-29 DIAGNOSIS — L821 Other seborrheic keratosis: Secondary | ICD-10-CM | POA: Diagnosis not present

## 2024-04-29 DIAGNOSIS — L720 Epidermal cyst: Secondary | ICD-10-CM | POA: Diagnosis not present

## 2024-04-29 DIAGNOSIS — D1801 Hemangioma of skin and subcutaneous tissue: Secondary | ICD-10-CM | POA: Diagnosis not present

## 2024-04-29 DIAGNOSIS — L218 Other seborrheic dermatitis: Secondary | ICD-10-CM | POA: Diagnosis not present

## 2024-04-29 DIAGNOSIS — L570 Actinic keratosis: Secondary | ICD-10-CM | POA: Diagnosis not present

## 2024-04-29 DIAGNOSIS — L814 Other melanin hyperpigmentation: Secondary | ICD-10-CM | POA: Diagnosis not present

## 2024-05-02 DIAGNOSIS — Z8249 Family history of ischemic heart disease and other diseases of the circulatory system: Secondary | ICD-10-CM | POA: Diagnosis not present

## 2024-05-02 DIAGNOSIS — Z008 Encounter for other general examination: Secondary | ICD-10-CM | POA: Diagnosis not present

## 2024-05-02 DIAGNOSIS — Z9989 Dependence on other enabling machines and devices: Secondary | ICD-10-CM | POA: Diagnosis not present

## 2024-05-02 DIAGNOSIS — Z85828 Personal history of other malignant neoplasm of skin: Secondary | ICD-10-CM | POA: Diagnosis not present

## 2024-05-02 DIAGNOSIS — Z9181 History of falling: Secondary | ICD-10-CM | POA: Diagnosis not present

## 2024-05-02 DIAGNOSIS — Z87891 Personal history of nicotine dependence: Secondary | ICD-10-CM | POA: Diagnosis not present

## 2024-05-02 DIAGNOSIS — Z96649 Presence of unspecified artificial hip joint: Secondary | ICD-10-CM | POA: Diagnosis not present

## 2024-05-04 DIAGNOSIS — R260 Ataxic gait: Secondary | ICD-10-CM | POA: Diagnosis not present

## 2024-05-04 DIAGNOSIS — M25551 Pain in right hip: Secondary | ICD-10-CM | POA: Diagnosis not present

## 2024-05-04 DIAGNOSIS — S72144S Nondisplaced intertrochanteric fracture of right femur, sequela: Secondary | ICD-10-CM | POA: Diagnosis not present

## 2024-05-05 DIAGNOSIS — N4231 Prostatic intraepithelial neoplasia: Secondary | ICD-10-CM | POA: Diagnosis not present

## 2024-05-05 DIAGNOSIS — R972 Elevated prostate specific antigen [PSA]: Secondary | ICD-10-CM | POA: Diagnosis not present

## 2024-05-05 DIAGNOSIS — R399 Unspecified symptoms and signs involving the genitourinary system: Secondary | ICD-10-CM | POA: Diagnosis not present

## 2024-05-18 DIAGNOSIS — M25551 Pain in right hip: Secondary | ICD-10-CM | POA: Diagnosis not present

## 2024-05-18 DIAGNOSIS — S72144S Nondisplaced intertrochanteric fracture of right femur, sequela: Secondary | ICD-10-CM | POA: Diagnosis not present

## 2024-05-18 DIAGNOSIS — R260 Ataxic gait: Secondary | ICD-10-CM | POA: Diagnosis not present

## 2024-05-24 DIAGNOSIS — M25551 Pain in right hip: Secondary | ICD-10-CM | POA: Diagnosis not present

## 2024-05-24 DIAGNOSIS — R260 Ataxic gait: Secondary | ICD-10-CM | POA: Diagnosis not present

## 2024-05-24 DIAGNOSIS — S72144S Nondisplaced intertrochanteric fracture of right femur, sequela: Secondary | ICD-10-CM | POA: Diagnosis not present

## 2024-05-28 DIAGNOSIS — R052 Subacute cough: Secondary | ICD-10-CM | POA: Diagnosis not present

## 2024-05-31 DIAGNOSIS — S72144S Nondisplaced intertrochanteric fracture of right femur, sequela: Secondary | ICD-10-CM | POA: Diagnosis not present

## 2024-05-31 DIAGNOSIS — R260 Ataxic gait: Secondary | ICD-10-CM | POA: Diagnosis not present

## 2024-05-31 DIAGNOSIS — M25551 Pain in right hip: Secondary | ICD-10-CM | POA: Diagnosis not present

## 2024-06-07 DIAGNOSIS — S72144S Nondisplaced intertrochanteric fracture of right femur, sequela: Secondary | ICD-10-CM | POA: Diagnosis not present

## 2024-06-07 DIAGNOSIS — R260 Ataxic gait: Secondary | ICD-10-CM | POA: Diagnosis not present

## 2024-06-07 DIAGNOSIS — M25551 Pain in right hip: Secondary | ICD-10-CM | POA: Diagnosis not present
